# Patient Record
Sex: Female | Born: 1980 | Race: Black or African American | Hispanic: No | Smoking: Former smoker
Health system: Southern US, Community
[De-identification: ages and names within clinical notes are randomized; demographics above are authoritative.]

## PROBLEM LIST (undated history)

## (undated) DIAGNOSIS — R0600 Dyspnea, unspecified: Secondary | ICD-10-CM

## (undated) DIAGNOSIS — M5136 Other intervertebral disc degeneration, lumbar region: Secondary | ICD-10-CM

## (undated) DIAGNOSIS — R87619 Unspecified abnormal cytological findings in specimens from cervix uteri: Secondary | ICD-10-CM

## (undated) DIAGNOSIS — F419 Anxiety disorder, unspecified: Secondary | ICD-10-CM

## (undated) DIAGNOSIS — R7303 Prediabetes: Secondary | ICD-10-CM

## (undated) DIAGNOSIS — M549 Dorsalgia, unspecified: Secondary | ICD-10-CM

## (undated) DIAGNOSIS — M51369 Other intervertebral disc degeneration, lumbar region without mention of lumbar back pain or lower extremity pain: Secondary | ICD-10-CM

## (undated) HISTORY — PX: TUBAL LIGATION: SHX77

## (undated) HISTORY — DX: Other intervertebral disc degeneration, lumbar region: M51.36

## (undated) HISTORY — DX: Other intervertebral disc degeneration, lumbar region without mention of lumbar back pain or lower extremity pain: M51.369

## (undated) HISTORY — DX: Unspecified abnormal cytological findings in specimens from cervix uteri: R87.619

---

## 2004-11-04 ENCOUNTER — Emergency Department (HOSPITAL_COMMUNITY): Admission: EM | Admit: 2004-11-04 | Discharge: 2004-11-04 | Payer: Self-pay | Admitting: Emergency Medicine

## 2006-09-26 ENCOUNTER — Emergency Department (HOSPITAL_COMMUNITY): Admission: EM | Admit: 2006-09-26 | Discharge: 2006-09-26 | Payer: Self-pay | Admitting: Emergency Medicine

## 2006-12-15 ENCOUNTER — Emergency Department (HOSPITAL_COMMUNITY): Admission: EM | Admit: 2006-12-15 | Discharge: 2006-12-15 | Payer: Self-pay | Admitting: Emergency Medicine

## 2007-05-26 ENCOUNTER — Emergency Department (HOSPITAL_COMMUNITY): Admission: EM | Admit: 2007-05-26 | Discharge: 2007-05-26 | Payer: Self-pay | Admitting: Emergency Medicine

## 2008-10-15 ENCOUNTER — Emergency Department (HOSPITAL_COMMUNITY): Admission: EM | Admit: 2008-10-15 | Discharge: 2008-10-15 | Payer: Self-pay | Admitting: Emergency Medicine

## 2009-01-21 ENCOUNTER — Emergency Department (HOSPITAL_COMMUNITY): Admission: EM | Admit: 2009-01-21 | Discharge: 2009-01-21 | Payer: Self-pay | Admitting: Emergency Medicine

## 2009-05-10 ENCOUNTER — Emergency Department (HOSPITAL_COMMUNITY): Admission: EM | Admit: 2009-05-10 | Discharge: 2009-05-10 | Payer: Self-pay | Admitting: Emergency Medicine

## 2009-07-08 ENCOUNTER — Emergency Department (HOSPITAL_COMMUNITY): Admission: EM | Admit: 2009-07-08 | Discharge: 2009-07-08 | Payer: Self-pay | Admitting: Emergency Medicine

## 2009-07-18 ENCOUNTER — Emergency Department (HOSPITAL_COMMUNITY): Admission: EM | Admit: 2009-07-18 | Discharge: 2009-07-18 | Payer: Self-pay | Admitting: Emergency Medicine

## 2009-08-07 ENCOUNTER — Encounter: Payer: Self-pay | Admitting: Orthopedic Surgery

## 2009-10-16 ENCOUNTER — Encounter: Payer: Self-pay | Admitting: Orthopedic Surgery

## 2010-02-15 IMAGING — CT CT HEAD W/O CM
1 series · 16 of 30 positions shown, 20 images · non-contrast
Comparison: None

CLINICAL DATA: Headaches, dizziness, syncope.

CT HEAD WITHOUT CONTRAST
TECHNIQUE: Contiguous axial images were obtained from the base of
the skull through the vertex without contrast.

[Series 2: headseq 4.8 h37s · axial · 0.44mm/px · z∈[+96,+254]mm · 16 of 36 slices shown, 20 images]
[im 2/36  brain]
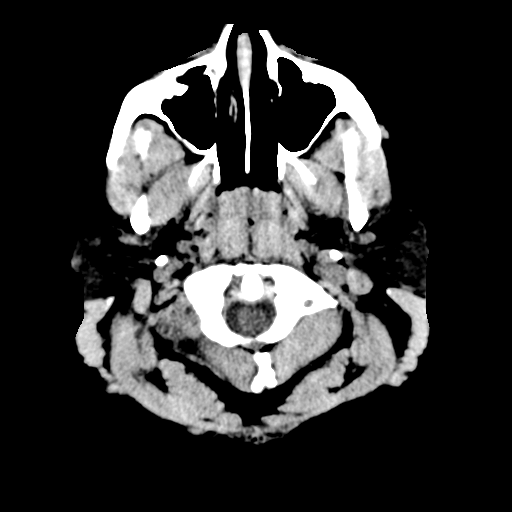
[im 2/36  bone]
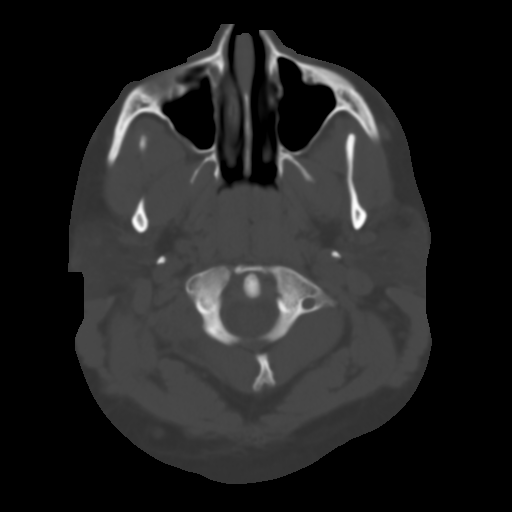
[im 4/36  brain]
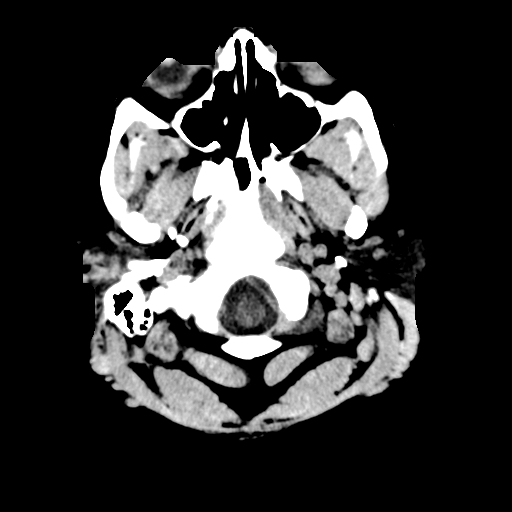
[im 7/36  brain]
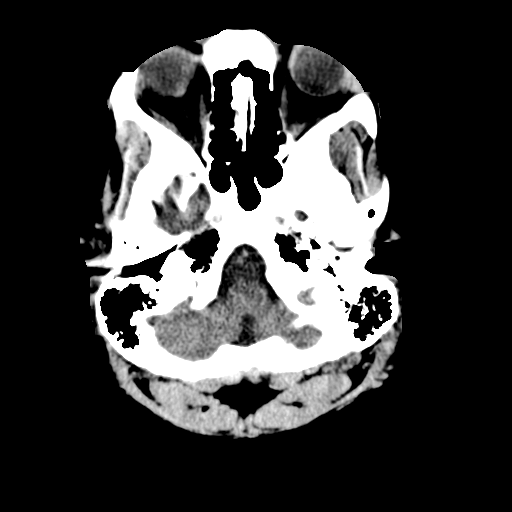
[im 9/36  brain]
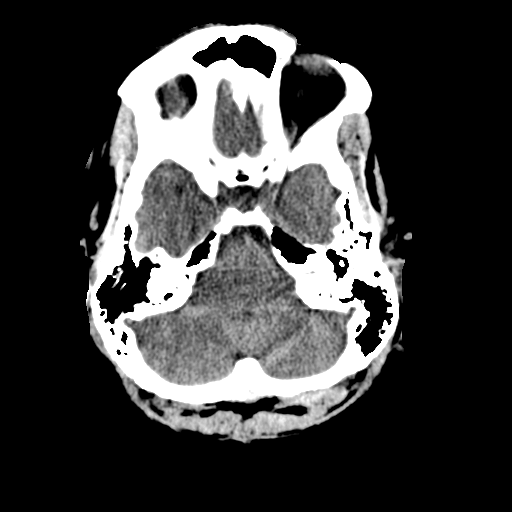
[im 10/36  brain]
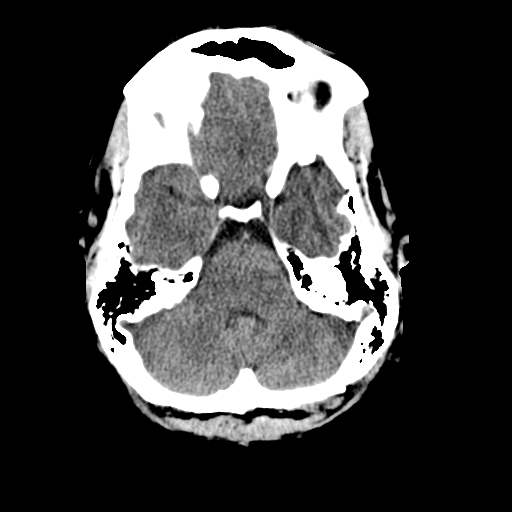
[im 10/36  bone]
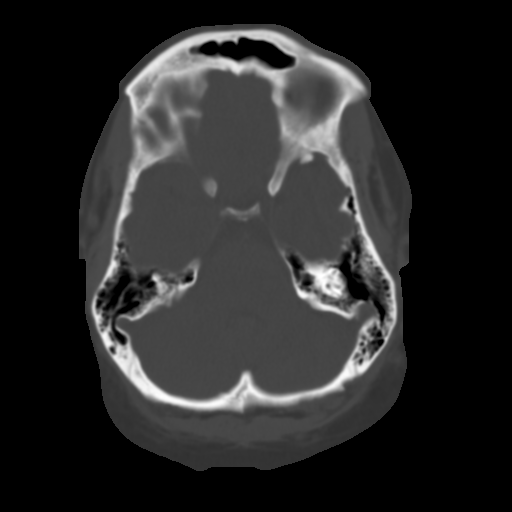
[im 13/36  brain]
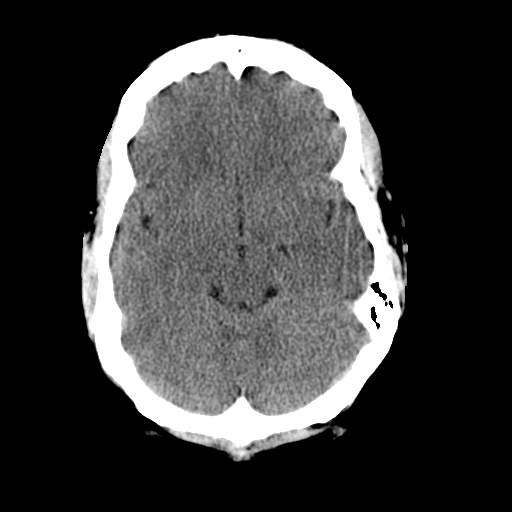
[im 15/36  brain]
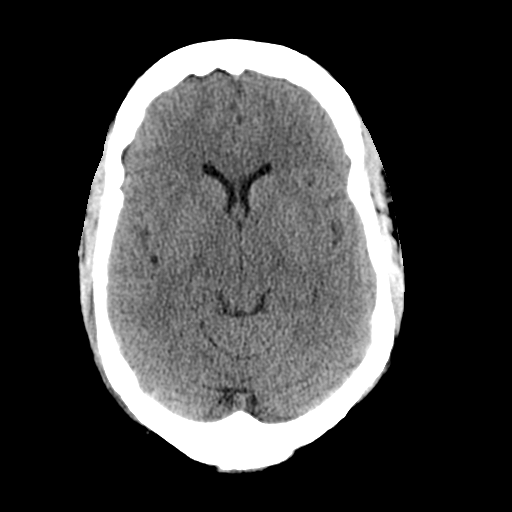
[im 17/36  brain]
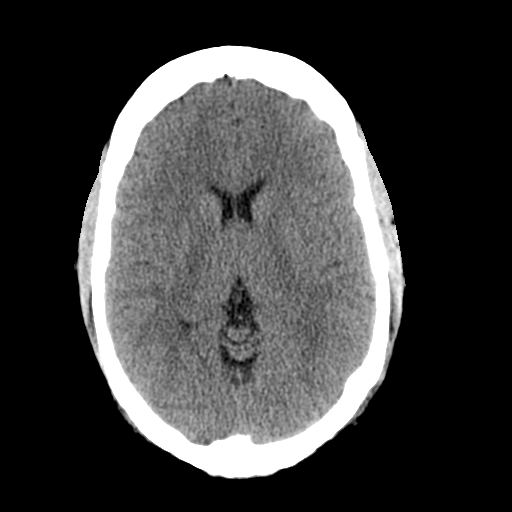
[im 19/36  brain]
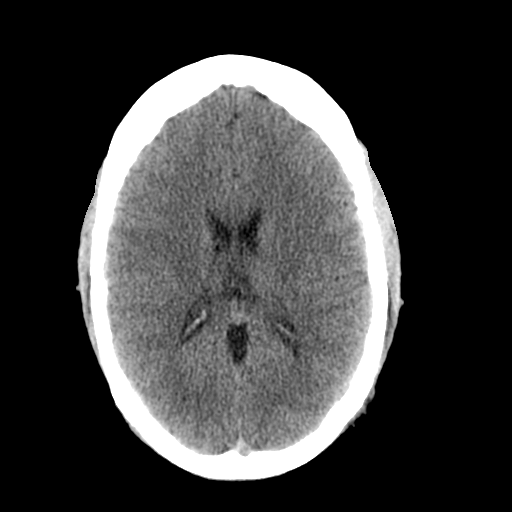
[im 19/36  bone]
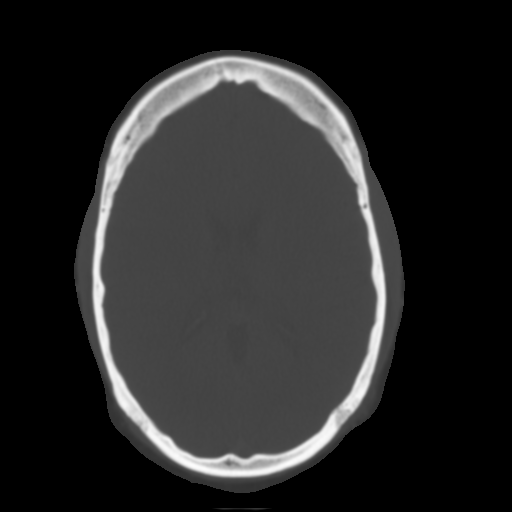
[im 21/36  brain]
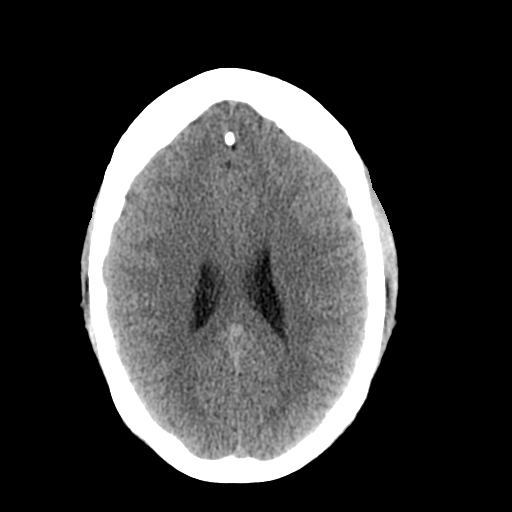
[im 23/36  brain]
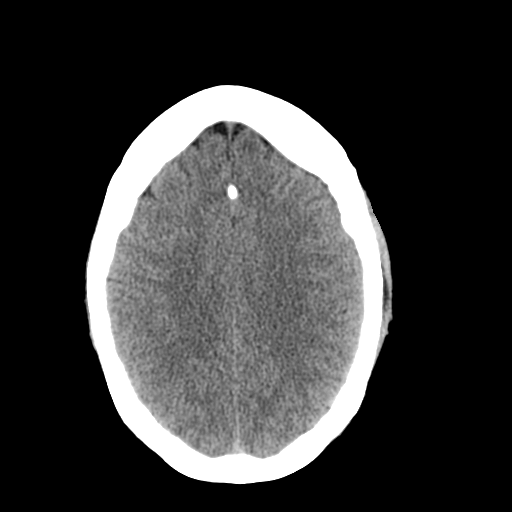
[im 26/36  brain]
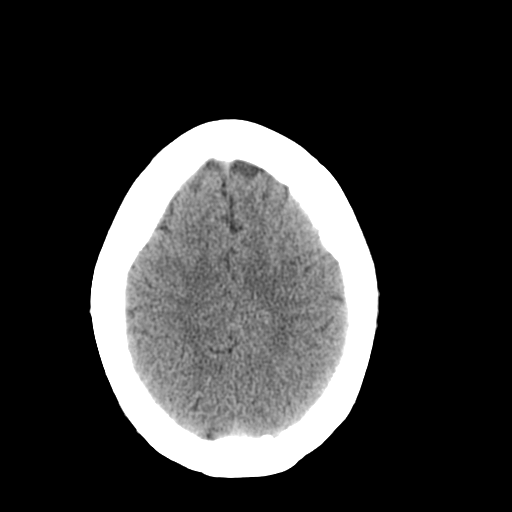
[im 27/36  brain]
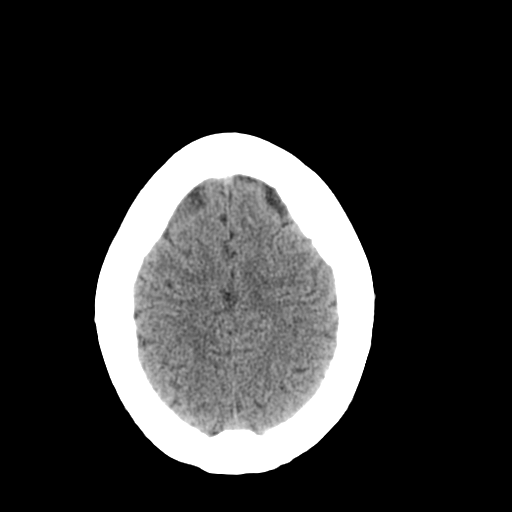
[im 27/36  bone]
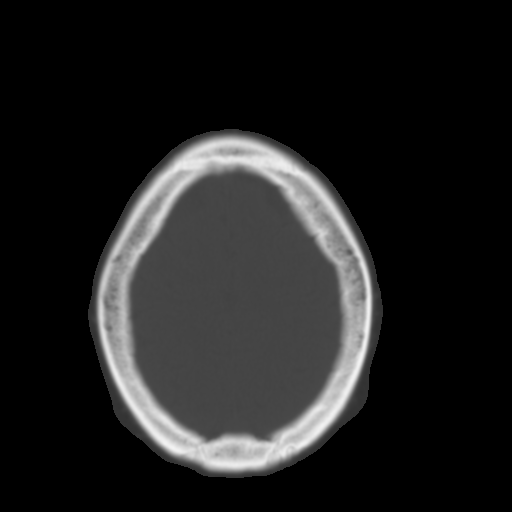
[im 29/36  brain]
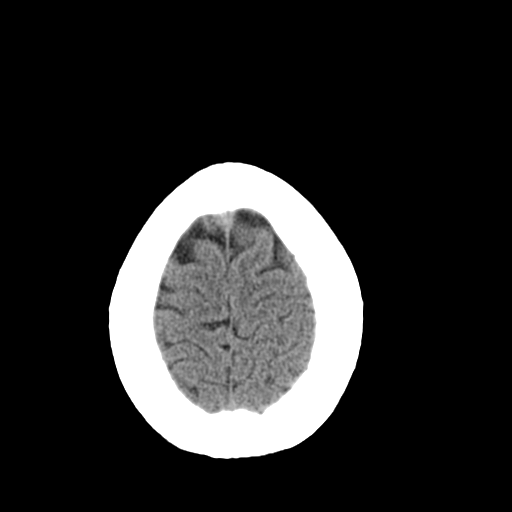
[im 32/36  brain]
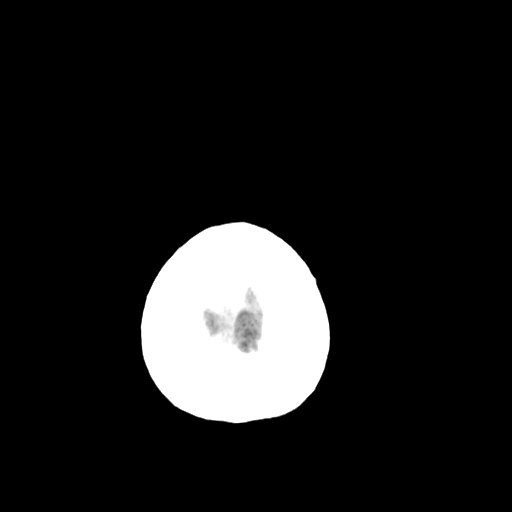
[im 34/36  brain]
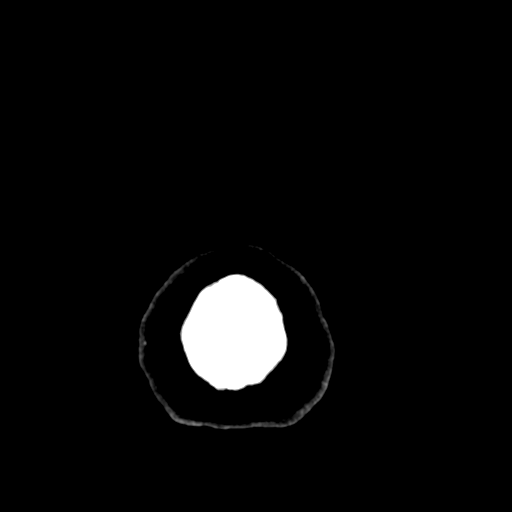

[16 of 30 positions shown; findings below may reference images not displayed]

FINDINGS: No acute intracranial abnormality.  Specifically, no
hemorrhage, hydrocephalus, mass lesion, acute infarction, or
significant intracranial injury.  No acute calvarial abnormality.

Visualized paranasal sinuses and mastoids clear.  Orbital soft
tissues unremarkable.
IMPRESSION: No acute intracranial abnormality.

## 2010-04-27 ENCOUNTER — Emergency Department (HOSPITAL_COMMUNITY): Admission: EM | Admit: 2010-04-27 | Discharge: 2010-04-27 | Payer: Self-pay | Admitting: Emergency Medicine

## 2010-05-05 ENCOUNTER — Emergency Department (HOSPITAL_COMMUNITY): Admission: EM | Admit: 2010-05-05 | Discharge: 2010-05-05 | Payer: Self-pay | Admitting: Emergency Medicine

## 2010-09-02 NOTE — Letter (Signed)
Summary: Phyllis Daniels Referral No Show  [facility]  [address] [place] [address]  [place], [place] [address]   Phone: [phone]  Fax: [phone]    10/15/09  [facility] [address] Attn: Referrals Ph   [phone] Fax [phone]   Re:    Phyllis Daniels DOB:    06-09-1981    The above named patient was a no show for the scheduled appointment:  Date:  10/08/09 Time:  1:45pm  Patient had been a no show for a previous appointment on 08/05/09.   Thank you for your kind referral.  Please feel free to contact our office if we can be of further service.  Sincerely,   [facility] Office of Dr. Terrance Mass, MD

## 2010-09-02 NOTE — Letter (Signed)
Summary: *Orthopedic No Show Letter  [facility]  [address]  [place], [place] [address]   Phone: [phone]  Fax: [phone]     08/07/2009    [facility] [address] [place], [place]  [address]   Dear Ms. Patterson Hammersmith,   Our records indicate that you missed your scheduled appointment with Dr. Beaulah Corin on _1/10/2009.  Please contact this office to reschedule your appointment as soon as possible.  It is important that you keep your scheduled appointments with your physician, so we can provide you the best care possible.  We have enclosed an appointment card for your convenience.      Sincerely,    Dr. Terrance Mass, MD [facility] Phone [phone]

## 2010-09-16 ENCOUNTER — Emergency Department (HOSPITAL_COMMUNITY)
Admission: EM | Admit: 2010-09-16 | Discharge: 2010-09-16 | Disposition: A | Discharge status: Home / Self Care | Payer: Self-pay | Attending: Emergency Medicine | Admitting: Emergency Medicine

## 2010-09-16 DIAGNOSIS — G8929 Other chronic pain: Secondary | ICD-10-CM | POA: Insufficient documentation

## 2010-09-16 DIAGNOSIS — M549 Dorsalgia, unspecified: Secondary | ICD-10-CM | POA: Insufficient documentation

## 2010-10-16 LAB — CBC
HCT: 37 % (ref 36.0–46.0)
MCH: 29.5 pg (ref 26.0–34.0)
MCV: 89.5 fL (ref 78.0–100.0)
Platelets: 169 10*3/uL (ref 150–400)
RBC: 4.13 MIL/uL (ref 3.87–5.11)
WBC: 9.9 10*3/uL (ref 4.0–10.5)

## 2010-10-16 LAB — DIFFERENTIAL
Eosinophils Absolute: 0.4 10*3/uL (ref 0.0–0.7)
Eosinophils Relative: 4 % (ref 0–5)
Lymphocytes Relative: 21 % (ref 12–46)
Lymphs Abs: 2.1 10*3/uL (ref 0.7–4.0)
Monocytes Absolute: 0.6 10*3/uL (ref 0.1–1.0)
Monocytes Relative: 6 % (ref 3–12)

## 2010-10-16 LAB — URINALYSIS, ROUTINE W REFLEX MICROSCOPIC
Bilirubin Urine: NEGATIVE
Glucose, UA: NEGATIVE mg/dL
Protein, ur: NEGATIVE mg/dL
Urobilinogen, UA: 0.2 mg/dL (ref 0.0–1.0)

## 2010-10-16 LAB — POCT CARDIAC MARKERS
CKMB, poc: <1 ng/mL — ABNORMAL LOW (ref 1.0–8.0)
Troponin i, poc: <0.05 ng/mL (ref 0.00–0.09)

## 2010-10-16 LAB — BASIC METABOLIC PANEL
BUN: 11 mg/dL (ref 6–23)
CO2: 29 mEq/L (ref 19–32)
Chloride: 107 mEq/L (ref 96–112)
Creatinine, Ser: 0.76 mg/dL (ref 0.4–1.2)
GFR calc Af Amer: >60 mL/min (ref >60)
Potassium: 3.9 mEq/L (ref 3.5–5.1)

## 2010-10-16 LAB — POCT PREGNANCY, URINE: Preg Test, Ur: NEGATIVE

## 2010-10-16 LAB — URINE MICROSCOPIC-ADD ON

## 2010-11-13 LAB — URINALYSIS, ROUTINE W REFLEX MICROSCOPIC
Glucose, UA: NEGATIVE mg/dL
Ketones, ur: NEGATIVE mg/dL
Nitrite: NEGATIVE
Protein, ur: NEGATIVE mg/dL
Urobilinogen, UA: 0.2 mg/dL (ref 0.0–1.0)

## 2010-11-13 LAB — BASIC METABOLIC PANEL
Chloride: 106 mEq/L (ref 96–112)
Creatinine, Ser: 0.89 mg/dL (ref 0.4–1.2)
GFR calc Af Amer: >60 mL/min (ref >60)
Sodium: 141 mEq/L (ref 135–145)

## 2010-11-13 LAB — RAPID URINE DRUG SCREEN, HOSP PERFORMED
Barbiturates: NOT DETECTED
Benzodiazepines: NOT DETECTED

## 2010-11-13 LAB — CBC
Hemoglobin: 12.7 g/dL (ref 12.0–15.0)
MCV: 88.8 fL (ref 78.0–100.0)
RBC: 4.28 MIL/uL (ref 3.87–5.11)
WBC: 7.7 10*3/uL (ref 4.0–10.5)

## 2010-11-13 LAB — DIFFERENTIAL
Eosinophils Absolute: 0.3 10*3/uL (ref 0.0–0.7)
Lymphs Abs: 1.8 10*3/uL (ref 0.7–4.0)
Monocytes Absolute: 0.2 10*3/uL (ref 0.1–1.0)
Monocytes Relative: 3 % (ref 3–12)
Neutro Abs: 5.4 10*3/uL (ref 1.7–7.7)
Neutrophils Relative %: 70 % (ref 43–77)

## 2010-11-13 LAB — PREGNANCY, URINE: Preg Test, Ur: NEGATIVE

## 2010-11-13 LAB — ETHANOL: Alcohol, Ethyl (B): <5 mg/dL (ref 0–10)

## 2011-04-22 ENCOUNTER — Encounter: Payer: Self-pay | Admitting: *Deleted

## 2011-04-22 ENCOUNTER — Emergency Department (HOSPITAL_COMMUNITY)
Admission: EM | Admit: 2011-04-22 | Discharge: 2011-04-22 | Disposition: A | Discharge status: Home / Self Care | Payer: Self-pay | Attending: Emergency Medicine | Admitting: Emergency Medicine

## 2011-04-22 DIAGNOSIS — F17200 Nicotine dependence, unspecified, uncomplicated: Secondary | ICD-10-CM | POA: Insufficient documentation

## 2011-04-22 DIAGNOSIS — M545 Low back pain, unspecified: Secondary | ICD-10-CM | POA: Insufficient documentation

## 2011-04-22 DIAGNOSIS — M549 Dorsalgia, unspecified: Secondary | ICD-10-CM

## 2011-04-22 HISTORY — DX: Dorsalgia, unspecified: M54.9

## 2011-04-22 MED ORDER — HYDROCODONE-ACETAMINOPHEN 5-325 MG PO TABS: ORAL_TABLET | ORAL | Status: AC

## 2011-04-22 MED ORDER — METHOCARBAMOL 500 MG PO TABS
1000.0000 mg | ORAL_TABLET | Freq: Once | ORAL | Status: AC
  Administered 2011-04-22: 1000 mg via ORAL
  Filled 2011-04-22: qty 2

## 2011-04-22 MED ORDER — KETOROLAC TROMETHAMINE 60 MG/2ML IM SOLN
60.0000 mg | Freq: Once | INTRAMUSCULAR | Status: AC
  Administered 2011-04-22: 60 mg via INTRAMUSCULAR
  Filled 2011-04-22: qty 2

## 2011-04-22 MED ORDER — METHOCARBAMOL 500 MG PO TABS: 1000.0000 mg | ORAL_TABLET | Freq: Three times a day (TID) | ORAL | Status: AC

## 2011-04-22 NOTE — ED Provider Notes (Signed)
History     CSN: [id number] Arrival date & time: 04/22/2011  5:56 PM   Chief Complaint  Patient presents with  . Back Pain     (Include location/radiation/quality/duration/timing/severity/associated sxs/prior treatment) Patient is a 30 y.o. female presenting with back pain. The history is provided by the patient.  Back Pain  This is a recurrent problem. The current episode started yesterday. The problem occurs constantly. The problem has not changed since onset.The pain is associated with no known injury. The pain is present in the lumbar spine. The quality of the pain is described as aching. The pain radiates to the left thigh and right thigh. The pain is moderate. The symptoms are aggravated by bending, twisting and certain positions. The pain is the same all the time. Associated symptoms include leg pain and tingling. Pertinent negatives include no chest pain, no fever, no numbness, no abdominal pain, no bowel incontinence, no perianal numbness, no bladder incontinence, no dysuria, no pelvic pain, no paresis and no weakness. She has tried nothing for the symptoms. The treatment provided no relief. Risk factors include obesity.     Past Medical History  Diagnosis Date  . Back pain      Past Surgical History  Procedure Date  . Tubal ligation     History reviewed. No pertinent family history.  History  Substance Use Topics  . Smoking status: Current Everyday Smoker -- 0.5 packs/day  . Smokeless tobacco: Not on file  . Alcohol Use: No    OB History    Grav Para Term Preterm Abortions TAB SAB Ect Mult Living                  Review of Systems  Constitutional: Negative for fever.  Cardiovascular: Negative for chest pain.  Gastrointestinal: Negative for abdominal pain and bowel incontinence.  Genitourinary: Negative for bladder incontinence, dysuria, hematuria, decreased urine volume, vaginal bleeding, vaginal discharge, difficulty urinating and pelvic pain.    Musculoskeletal: Positive for back pain. Negative for gait problem.  Skin: Negative.   Neurological: Positive for tingling. Negative for weakness and numbness.  Hematological: Does not bruise/bleed easily.  All other systems reviewed and are negative.    Allergies  Review of patient's allergies indicates no known allergies.  Home Medications  No current outpatient prescriptions on file.  Physical Exam    BP 121/78  Pulse 70  Temp(Src) 99 F (37.2 C) (Oral)  Resp 20  Ht 6\' 1"  (1.854 m)  Wt 324 lb (146.965 kg)  BMI 42.75 kg/m2  SpO2 100%  Physical Exam  Nursing note and vitals reviewed. Constitutional: She is oriented to person, place, and time. She appears well-developed and well-nourished. No distress.  HENT:  Head: Normocephalic and atraumatic.  Neck: Normal range of motion. Neck supple.  Cardiovascular: Normal rate, regular rhythm and normal heart sounds.   Pulmonary/Chest: Effort normal and breath sounds normal.  Abdominal: Soft. Bowel sounds are normal. There is no tenderness.  Musculoskeletal: She exhibits tenderness. She exhibits no edema.       Lumbar back: She exhibits tenderness and pain. She exhibits normal range of motion, no bony tenderness, no swelling, no edema, no laceration, no spasm and normal pulse.  Neurological: She is alert and oriented to person, place, and time. She has normal reflexes. No cranial nerve deficit or sensory deficit. She exhibits normal muscle tone. Coordination and gait normal.  Reflex Scores:      Patellar reflexes are 2+ on the right side and 2+ on the  left side.      Achilles reflexes are 2+ on the right side and 2+ on the left side. Skin: Skin is warm and dry.    ED Course  Procedures        MDM   Patient has diffuse ttp of the lumbar paraspinal muscles and right SI joint space.  Patient is ambulatory, no focal neuro deficits on exam.  DTR's nml.  Has hx of chronic low back pain and pain today is similar to previous  episodes.  Patient is waiting for Medicaid approval to f/u with pain management.         Phyllis Daniels, [place] 04/26/11 2102

## 2011-04-22 NOTE — ED Notes (Signed)
Pt a/ox4. Resp even and unlabored. NAD at this time. D/C instructions reviewed with pt. Pt verbalized understanding. Pt ambulated with steady gate to POV. 

## 2011-04-22 NOTE — ED Notes (Signed)
C/o back pain onset yesterday

## 2011-05-01 NOTE — ED Provider Notes (Signed)
Medical screening examination/treatment/procedure(s) were performed by non-physician practitioner and as supervising physician I was immediately available for consultation/collaboration.   Pierre Cumpton, MD 05/01/11 0510 

## 2011-12-01 ENCOUNTER — Encounter (HOSPITAL_COMMUNITY): Payer: Self-pay

## 2011-12-01 ENCOUNTER — Emergency Department (HOSPITAL_COMMUNITY)
Admission: EM | Admit: 2011-12-01 | Discharge: 2011-12-01 | Disposition: A | Discharge status: Home / Self Care | Payer: Self-pay | Attending: Emergency Medicine | Admitting: Emergency Medicine

## 2011-12-01 DIAGNOSIS — R10819 Abdominal tenderness, unspecified site: Secondary | ICD-10-CM | POA: Insufficient documentation

## 2011-12-01 DIAGNOSIS — F17200 Nicotine dependence, unspecified, uncomplicated: Secondary | ICD-10-CM | POA: Insufficient documentation

## 2011-12-01 DIAGNOSIS — R109 Unspecified abdominal pain: Secondary | ICD-10-CM | POA: Insufficient documentation

## 2011-12-01 DIAGNOSIS — R609 Edema, unspecified: Secondary | ICD-10-CM | POA: Insufficient documentation

## 2011-12-01 LAB — COMPREHENSIVE METABOLIC PANEL
AST: 21 U/L (ref 0–37)
BUN: 7 mg/dL (ref 6–23)
CO2: 26 mEq/L (ref 19–32)
Calcium: 9.7 mg/dL (ref 8.4–10.5)
Creatinine, Ser: 0.73 mg/dL (ref 0.50–1.10)
GFR calc non Af Amer: >90 mL/min (ref >90)

## 2011-12-01 LAB — DIFFERENTIAL
Basophils Absolute: 0 10*3/uL (ref 0.0–0.1)
Basophils Relative: 0 % (ref 0–1)
Eosinophils Relative: 3 % (ref 0–5)
Lymphocytes Relative: 30 % (ref 12–46)
Monocytes Absolute: 0.5 10*3/uL (ref 0.1–1.0)
Monocytes Relative: 7 % (ref 3–12)

## 2011-12-01 LAB — URINALYSIS, ROUTINE W REFLEX MICROSCOPIC
Glucose, UA: NEGATIVE mg/dL
Hgb urine dipstick: NEGATIVE
Specific Gravity, Urine: >1.03 — ABNORMAL HIGH (ref 1.005–1.030)

## 2011-12-01 LAB — CBC
HCT: 44.3 % (ref 36.0–46.0)
MCHC: 32.5 g/dL (ref 30.0–36.0)
MCV: 90.6 fL (ref 78.0–100.0)
RDW: 13.4 % (ref 11.5–15.5)

## 2011-12-01 LAB — LIPASE, BLOOD: Lipase: 17 U/L (ref 11–59)

## 2011-12-01 LAB — POCT PREGNANCY, URINE: Preg Test, Ur: NEGATIVE

## 2011-12-01 MED ORDER — PANTOPRAZOLE SODIUM 40 MG IV SOLR
40.0000 mg | Freq: Once | INTRAVENOUS | Status: AC
  Administered 2011-12-01: 40 mg via INTRAVENOUS
  Filled 2011-12-01: qty 40

## 2011-12-01 MED ORDER — OMEPRAZOLE 40 MG PO CPDR: 40.0000 mg | DELAYED_RELEASE_CAPSULE | Freq: Every day | ORAL | Status: DC

## 2011-12-01 MED ORDER — GI COCKTAIL ~~LOC~~
30.0000 mL | Freq: Once | ORAL | Status: AC
  Administered 2011-12-01: 30 mL via ORAL
  Filled 2011-12-01: qty 30

## 2011-12-01 NOTE — ED Notes (Signed)
Pt says pain in lower abd feels like a cramp but then top of abd gets tight.

## 2011-12-01 NOTE — Discharge Instructions (Signed)
Abdominal Pain Abdominal pain can be caused by many things. Your caregiver decides the seriousness of your pain by an examination and possibly blood tests and X-rays. Many cases can be observed and treated at home. Most abdominal pain is not caused by a disease and will probably improve without treatment. However, in many cases, more time must pass before a clear cause of the pain can be found. Before that point, it may not be known if you need more testing, or if hospitalization or surgery is needed. HOME CARE INSTRUCTIONS   Do not take laxatives unless directed by your caregiver.   Take pain medicine only as directed by your caregiver.   Only take over-the-counter or prescription medicines for pain, discomfort, or fever as directed by your caregiver.   Try a clear liquid diet (broth, tea, or water) for as long as directed by your caregiver. Slowly move to a bland diet as tolerated.  SEEK IMMEDIATE MEDICAL CARE IF:   The pain does not go away.   You have a fever.   You keep throwing up (vomiting).   The pain is felt only in portions of the abdomen. Pain in the right side could possibly be appendicitis. In an adult, pain in the left lower portion of the abdomen could be colitis or diverticulitis.   You pass bloody or black tarry stools.  MAKE SURE YOU:   Understand these instructions.   Will watch your condition.   Will get help right away if you are not doing well or get worse.  Document Released: 04/29/2005 Document Revised: 07/09/2011 Document Reviewed: 03/07/2008 [facility] Patient Information 2012 [place], [place].  Omeprazole tablets (OTC) What is this medicine? OMEPRAZOLE (oh ME pray zol) prevents the production of acid in the stomach. It is used to treat the symptoms of heartburn. You can buy this medicine without a prescription. This product is not for long-term use, unless otherwise directed by your doctor or health care professional. This medicine may be used for other  purposes; ask your health care provider or pharmacist if you have questions. What should I tell my health care provider before I take this medicine? They need to know if you have any of these conditions: -black or bloody stools -chest pain -difficulty swallowing -have had heartburn for over 3 months -have heartburn with dizziness, lightheadedness or sweating -liver disease -stomach pain -unexplained weight loss -vomiting with blood -wheezing -an unusual or allergic reaction to omeprazole, other medicines, foods, dyes, or preservatives -pregnant or trying to get pregnant -breast-feeding How should I use this medicine? Take this medicine by mouth. Follow the directions on the product label. If you are taking this medicine without a prescription, take one tablet every day. Do not use for longer than 14 days or repeat a course of treatment more often than every 4 months unless directed by a doctor or healthcare professional. Take your dose at regular intervals every 24 hours. Swallow the tablet whole with a drink of water. Do not crush, break or chew. This medicine works best if taken on an empty stomach 30 minutes before breakfast. If you are using this medicine with the prescription of your doctor or healthcare professional, follow the directions you were given. Do not take your medicine more often than directed. Talk to your pediatrician regarding the use of this medicine in children. Special care may be needed. Overdosage: If you think you have taken too much of this medicine contact a poison control center or emergency room at once. NOTE:  This medicine is only for you. Do not share this medicine with others. What if I miss a dose? If you miss a dose, take it as soon as you can. If it is almost time for your next dose, take only that dose. Do not take double or extra doses. What may interact with this medicine? Do not take this medicine with any of the following  medications: -atazanavir -clopidogrel -nelfinavir This medicine may also interact with the following medications: -ampicillin -certain medicines for anxiety or sleep -certain medicines that treat or prevent blood clots like warfarin -cyclosporine -diazepam -digoxin -disulfiram -iron salts -phenytoin -prescription medicine for fungal or yeast infection like itraconazole, ketoconazole, voriconazole -saquinavir -tacrolimus This list may not describe all possible interactions. Give your health care provider a list of all the medicines, herbs, non-prescription drugs, or dietary supplements you use. Also tell them if you smoke, drink alcohol, or use illegal drugs. Some items may interact with your medicine. What should I watch for while using this medicine? It can take several days before your heartburn gets better. Check with your doctor or health care professional if your condition does not start to get better, or if it gets worse. Do not treat yourself for heartburn with this medicine for more than 14 days in a row. You should only use this medicine for a 2-week treatment period once every 4 months. If your symptoms return shortly after your therapy is complete, or within the 4 month time frame, call your doctor or health care professional. What side effects may I notice from receiving this medicine? Side effects that you should report to your doctor or health care professional as soon as possible: -allergic reactions like skin rash, itching or hives, swelling of the face, lips, or tongue -bone, muscle or joint pain -breathing problems -chest pain or chest tightness -dark yellow or brown urine -dizziness -fast, irregular heartbeat -feeling faint or lightheaded -fever or sore throat -muscle spasm -palpitations -redness, blistering, peeling or loosening of the skin, including inside the mouth -seizures -tremors -unusual bleeding or bruising -unusually weak or tired -yellowing of the  eyes or skin Side effects that usually do not require medical attention (Report these to your doctor or health care professional if they continue or are bothersome.): -constipation -diarrhea -dry mouth -headache -nausea This list may not describe all possible side effects. Call your doctor for medical advice about side effects. You may report side effects to FDA at 1-800-FDA-1088. Where should I keep my medicine? Keep out of the reach of children. Store at room temperature between 20 and 25 degrees C (68 and 77 degrees F). Protect from light and moisture. Throw away any unused medicine after the expiration date. NOTE: This sheet is a summary. It may not cover all possible information. If you have questions about this medicine, talk to your doctor, pharmacist, or health care provider.  2012, [employer]. (05/14/2010 1:43:26 PM)

## 2011-12-01 NOTE — ED Provider Notes (Signed)
History   This chart was scribed for Dione Booze, MD by Sofie Rower. The patient was seen in room APA10/APA10 and the patient's care was started at 12:00 PM     CSN: [id number]  Arrival date & time 12/01/11  1122   First MD Initiated Contact with Patient 12/01/11 1149      Chief Complaint  Patient presents with  . Abdominal Pain    (Consider location/radiation/quality/duration/timing/severity/associated sxs/prior treatment) HPI  Phyllis Daniels is a 31 y.o. female who presents to the Emergency Department complaining of moderate, episodic abdominal pain located at the upper abdomen onset two days ago with associated symptoms of tightness, cramping, loss of sleep. The pt states "the abdominal pain has been bothering her at night." The pt informs the EDP that the "abdominal pain is a bubbling pain." While exercising yesterday, the pt states "it felt like I was getting cold and I was going to faint." Modifying factors include exercising which intensifies the pain. Pt has a hx of tubal ligation.   Pt denies nausea, vomiting, similar symptoms in the past.   Pt is a smoker, smokes 6-7 cigarettes a day.   PCP is [facility].   Past Medical History  Diagnosis Date  . Back pain     Past Surgical History  Procedure Date  . Tubal ligation      History  Substance Use Topics  . Smoking status: Current Everyday Smoker -- 0.5 packs/day  . Smokeless tobacco: Not on file  . Alcohol Use: No    OB History    Grav Para Term Preterm Abortions TAB SAB Ect Mult Living                  Review of Systems  All other systems reviewed and are negative.    10 Systems reviewed and all are negative for acute change except as noted in the HPI.    Allergies  Review of patient's allergies indicates no known allergies.  Home Medications  No current outpatient prescriptions on file.  BP 122/107  Pulse 80  Temp(Src) 98.5 F (36.9 C) (Oral)  Resp 20  Ht 6' (1.829 m)   Wt 323 lb (146.512 kg)  BMI 43.81 kg/m2  SpO2 98%  Physical Exam  Nursing note and vitals reviewed. Constitutional: She is oriented to person, place, and time. She appears well-developed and well-nourished.  HENT:  Head: Normocephalic and atraumatic.  Right Ear: External ear normal.  Left Ear: External ear normal.  Nose: Nose normal.  Eyes: Conjunctivae and EOM are normal. Right eye exhibits no discharge. Left eye exhibits no discharge. No scleral icterus.       No scleral icterus.   Neck: Normal range of motion. Neck supple.  Cardiovascular: Normal rate, regular rhythm and normal heart sounds.   Pulmonary/Chest: Effort normal.  Abdominal: There is tenderness (Mild across the upper abdomen, no rebound, no guarding. ). There is no rebound and no guarding.  Musculoskeletal: Normal range of motion.       1+ edema.   Neurological: She is alert and oriented to person, place, and time.  Skin: Skin is warm and dry.  Psychiatric: She has a normal mood and affect. Her behavior is normal.    ED Course  Procedures (including critical care time)  DIAGNOSTIC STUDIES: Oxygen Saturation is 98% on room air, normal by my interpretation.    COORDINATION OF CARE:    Results for orders placed during the hospital encounter of 12/01/11  CBC  Component Value Range   WBC 7.8  4.0 - 10.5 (K/uL)   RBC 4.89  3.87 - 5.11 (MIL/uL)   Hemoglobin 14.4  12.0 - 15.0 (g/dL)   HCT 08.6  57.8 - 46.9 (%)   MCV 90.6  78.0 - 100.0 (fL)   MCH 29.4  26.0 - 34.0 (pg)   MCHC 32.5  30.0 - 36.0 (g/dL)   RDW 62.9  52.8 - 41.3 (%)   Platelets 248  150 - 400 (K/uL)  DIFFERENTIAL      Component Value Range   Neutrophils Relative 60  43 - 77 (%)   Neutro Abs 4.7  1.7 - 7.7 (K/uL)   Lymphocytes Relative 30  12 - 46 (%)   Lymphs Abs 2.3  0.7 - 4.0 (K/uL)   Monocytes Relative 7  3 - 12 (%)   Monocytes Absolute 0.5  0.1 - 1.0 (K/uL)   Eosinophils Relative 3  0 - 5 (%)   Eosinophils Absolute 0.3  0.0 - 0.7  (K/uL)   Basophils Relative 0  0 - 1 (%)   Basophils Absolute 0.0  0.0 - 0.1 (K/uL)  COMPREHENSIVE METABOLIC PANEL      Component Value Range   Sodium 139  135 - 145 (mEq/L)   Potassium 3.7  3.5 - 5.1 (mEq/L)   Chloride 103  96 - 112 (mEq/L)   CO2 26  19 - 32 (mEq/L)   Glucose, Bld 96  70 - 99 (mg/dL)   BUN 7  6 - 23 (mg/dL)   Creatinine, Ser 2.44  0.50 - 1.10 (mg/dL)   Calcium 9.7  8.4 - 01.0 (mg/dL)   Total Protein 7.5  6.0 - 8.3 (g/dL)   Albumin 3.8  3.5 - 5.2 (g/dL)   AST 21  0 - 37 (U/L)   ALT 18  0 - 35 (U/L)   Alkaline Phosphatase 108  39 - 117 (U/L)   Total Bilirubin 0.4  0.3 - 1.2 (mg/dL)   GFR calc non Af Amer >90  >90 (mL/min)   GFR calc Af Amer >90  >90 (mL/min)  LIPASE, BLOOD      Component Value Range   Lipase 17  11 - 59 (U/L)  POCT PREGNANCY, URINE      Component Value Range   Preg Test, Ur NEGATIVE  NEGATIVE    She was given IV hydration, oral GI cocktail, and IV protonic 6. Following this, she had moderate improvement. She will be sent home with a prescription for Prilosec OTC and she is to followup with her PCP in [facility]. She is to return if symptoms worsen.  1. Abdominal pain     12:03PM- EDP at bedside discusses treatment plan concerning blood tests.   MDM  Abdominal pain which may represent gastritis.      I personally performed the services described in this documentation, which was scribed in my presence. The recorded information has been reviewed and considered.      Dione Booze, MD 12/01/11 1450

## 2011-12-01 NOTE — ED Notes (Signed)
Pt c/o lower abd pain since Sunday.  Also reports some constipation.  LBM was this am but says was small.  LMP was last year some time.  Reports has had irregular periods.

## 2012-03-14 ENCOUNTER — Encounter (HOSPITAL_COMMUNITY): Payer: Self-pay | Admitting: *Deleted

## 2012-03-14 ENCOUNTER — Emergency Department (HOSPITAL_COMMUNITY)
Admission: EM | Admit: 2012-03-14 | Discharge: 2012-03-14 | Disposition: A | Discharge status: Home / Self Care | Payer: Self-pay | Attending: Emergency Medicine | Admitting: Emergency Medicine

## 2012-03-14 DIAGNOSIS — F17200 Nicotine dependence, unspecified, uncomplicated: Secondary | ICD-10-CM | POA: Insufficient documentation

## 2012-03-14 DIAGNOSIS — M549 Dorsalgia, unspecified: Secondary | ICD-10-CM | POA: Insufficient documentation

## 2012-03-14 DIAGNOSIS — M5430 Sciatica, unspecified side: Secondary | ICD-10-CM

## 2012-03-14 MED ORDER — METHYLPREDNISOLONE SODIUM SUCC 125 MG IJ SOLR
125.0000 mg | Freq: Once | INTRAMUSCULAR | Status: AC
  Administered 2012-03-14: 125 mg via INTRAMUSCULAR
  Filled 2012-03-14: qty 2

## 2012-03-14 MED ORDER — HYDROCODONE-ACETAMINOPHEN 5-325 MG PO TABS: 1.0000 | ORAL_TABLET | Freq: Four times a day (QID) | ORAL | Status: AC | PRN

## 2012-03-14 MED ORDER — PREDNISONE 10 MG PO TABS: 20.0000 mg | ORAL_TABLET | Freq: Every day | ORAL | Status: DC

## 2012-03-14 MED ORDER — HYDROMORPHONE HCL PF 1 MG/ML IJ SOLN
1.0000 mg | Freq: Once | INTRAMUSCULAR | Status: AC
  Administered 2012-03-14: 1 mg via INTRAMUSCULAR
  Filled 2012-03-14: qty 1

## 2012-03-14 MED ORDER — CYCLOBENZAPRINE HCL 10 MG PO TABS: 10.0000 mg | ORAL_TABLET | Freq: Three times a day (TID) | ORAL | Status: AC | PRN

## 2012-03-14 NOTE — ED Notes (Signed)
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

## 2012-03-14 NOTE — ED Provider Notes (Signed)
History   This chart was scribed for Phyllis Lennert, MD by Charolett Bumpers . The patient was seen in room APA19/APA19. Patient's care was started at 0756.    CSN: [id number]  Arrival date & time 03/14/12  4098   First MD Initiated Contact with Patient 03/14/12 0756      Chief Complaint  Patient presents with  . Back Pain    (Consider location/radiation/quality/duration/timing/severity/associated sxs/prior treatment) HPI Comments: Phyllis Daniels is a 31 y.o. female who presents to the Emergency Department complaining of constant, moderate lower back pain with an onset of last night. 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 Pt reports that the lower back pain radiates to her right leg, described as sharp pain shooting down right leg. Pt reports a h/o back pain and reports that today's symptoms feel similar to previous episodes of back pain.   Patient is a 31 y.o. female presenting with back pain. The history is provided by the patient.  Back Pain  This is a recurrent problem. The current episode started yesterday. The problem occurs constantly. The pain is associated with lifting heavy objects. The pain is present in the lumbar spine. The quality of the pain is described as shooting. The pain radiates to the right thigh. The pain is moderate. Pertinent negatives include no chest pain, no headaches and no abdominal pain.     Past Medical History  Diagnosis Date  . Back pain     Past Surgical History  Procedure Date  . Tubal ligation     No family history on file.  History  Substance Use Topics  . Smoking status: Current Everyday Smoker -- 0.5 packs/day  . Smokeless tobacco: Not on file  . Alcohol Use: No    OB History    Grav Para Term Preterm Abortions TAB SAB Ect Mult Living                  Review of Systems  Constitutional: Negative for fatigue.  HENT: Negative for congestion, sinus pressure and ear discharge.    Eyes: Negative for discharge.  Respiratory: Negative for cough.   Cardiovascular: Negative for chest pain.  Gastrointestinal: Negative for abdominal pain and diarrhea.  Genitourinary: Negative for frequency and hematuria.  Musculoskeletal: Positive for back pain.  Skin: Negative for rash.  Neurological: Negative for seizures and headaches.  Hematological: Negative.   Psychiatric/Behavioral: Negative for hallucinations.  All other systems reviewed and are negative.    Allergies  Review of patient's allergies indicates no known allergies.  Home Medications   Current Outpatient Rx  Name Route Sig Dispense Refill  . OMEPRAZOLE 40 MG PO CPDR Oral Take 1 capsule (40 mg total) by mouth daily. 20 capsule 0    BP 125/86  Pulse 63  Temp 98.3 F (36.8 C) (Oral)  Resp 20  Ht 6' (1.829 m)  Wt 327 lb (148.326 kg)  BMI 44.35 kg/m2  SpO2 98%  LMP 03/10/2012  Physical Exam  Constitutional: She is oriented to person, place, and time. She appears well-developed.  HENT:  Head: Normocephalic and atraumatic.  Eyes: Conjunctivae and EOM are normal. No scleral icterus.  Neck: Neck supple. No thyromegaly present.  Cardiovascular: Normal rate, regular rhythm and normal heart sounds.  Exam reveals no gallop and no friction rub.   No murmur heard. Pulmonary/Chest: Breath sounds normal. No stridor. She has no wheezes. She has no rales. She exhibits no  tenderness.  Abdominal: Soft. She exhibits no distension. There is no tenderness. There is no rebound.  Musculoskeletal: Normal range of motion. She exhibits tenderness. She exhibits no edema.       Tenderness to lumbar spine with positive straight leg raise on the right.   Lymphadenopathy:    She has no cervical adenopathy.  Neurological: She is oriented to person, place, and time. Coordination normal.  Skin: No rash noted. No erythema.  Psychiatric: She has a normal mood and affect. Her behavior is normal.    ED Course  Procedures  (including critical care time)  DIAGNOSTIC STUDIES: Oxygen Saturation is 98% on room air, normal by my interpretation.    COORDINATION OF CARE:  08:03-Discussed planned course of treatment with the patient, who is agreeable at this time.   08:15-Medication Orders: Methylprednisolone sodium succinate (Solumedrol) 125 mg/2 mL injection 125 mg-once; Hydromorphone (Dilaudid) injection 1 mg-once.   Labs Reviewed - No data to display No results found.   No diagnosis found.    MDM    The chart was scribed for me under my direct supervision.  I personally performed the history, physical, and medical decision making and all procedures in the evaluation of this patient.Phyllis Lennert, MD 03/14/12 [phone]

## 2012-12-05 ENCOUNTER — Emergency Department (HOSPITAL_COMMUNITY)
Admission: EM | Admit: 2012-12-05 | Discharge: 2012-12-05 | Disposition: A | Discharge status: Home / Self Care | Payer: Self-pay | Attending: Emergency Medicine | Admitting: Emergency Medicine

## 2012-12-05 ENCOUNTER — Encounter (HOSPITAL_COMMUNITY): Payer: Self-pay | Admitting: *Deleted

## 2012-12-05 DIAGNOSIS — R42 Dizziness and giddiness: Secondary | ICD-10-CM | POA: Insufficient documentation

## 2012-12-05 DIAGNOSIS — H5789 Other specified disorders of eye and adnexa: Secondary | ICD-10-CM | POA: Insufficient documentation

## 2012-12-05 DIAGNOSIS — R112 Nausea with vomiting, unspecified: Secondary | ICD-10-CM | POA: Insufficient documentation

## 2012-12-05 DIAGNOSIS — R51 Headache: Secondary | ICD-10-CM | POA: Insufficient documentation

## 2012-12-05 DIAGNOSIS — F17200 Nicotine dependence, unspecified, uncomplicated: Secondary | ICD-10-CM | POA: Insufficient documentation

## 2012-12-05 LAB — COMPREHENSIVE METABOLIC PANEL
ALT: 21 U/L (ref 0–35)
AST: 20 U/L (ref 0–37)
Albumin: 3.8 g/dL (ref 3.5–5.2)
Alkaline Phosphatase: 102 U/L (ref 39–117)
CO2: 26 mEq/L (ref 19–32)
Chloride: 106 mEq/L (ref 96–112)
GFR calc non Af Amer: >90 mL/min (ref >90)
Potassium: 3.7 mEq/L (ref 3.5–5.1)
Sodium: 140 mEq/L (ref 135–145)
Total Bilirubin: 0.5 mg/dL (ref 0.3–1.2)

## 2012-12-05 LAB — CBC WITH DIFFERENTIAL/PLATELET
Basophils Absolute: 0 10*3/uL (ref 0.0–0.1)
Basophils Relative: 0 % (ref 0–1)
HCT: 39.8 % (ref 36.0–46.0)
Lymphocytes Relative: 30 % (ref 12–46)
MCHC: 33.7 g/dL (ref 30.0–36.0)
Monocytes Absolute: 0.5 10*3/uL (ref 0.1–1.0)
Neutro Abs: 4.4 10*3/uL (ref 1.7–7.7)
Neutrophils Relative %: 62 % (ref 43–77)
Platelets: 269 10*3/uL (ref 150–400)
RDW: 13.1 % (ref 11.5–15.5)
WBC: 7.1 10*3/uL (ref 4.0–10.5)

## 2012-12-05 MED ORDER — METOCLOPRAMIDE HCL 5 MG/ML IJ SOLN
10.0000 mg | Freq: Once | INTRAMUSCULAR | Status: AC
  Administered 2012-12-05: 10 mg via INTRAVENOUS
  Filled 2012-12-05: qty 2

## 2012-12-05 MED ORDER — DIPHENHYDRAMINE HCL 50 MG/ML IJ SOLN
25.0000 mg | Freq: Once | INTRAMUSCULAR | Status: AC
  Administered 2012-12-05: 25 mg via INTRAVENOUS
  Filled 2012-12-05: qty 1

## 2012-12-05 MED ORDER — IBUPROFEN 800 MG PO TABS: 800.0000 mg | ORAL_TABLET | Freq: Three times a day (TID) | ORAL | Status: DC | PRN

## 2012-12-05 MED ORDER — KETOROLAC TROMETHAMINE 30 MG/ML IJ SOLN
30.0000 mg | Freq: Once | INTRAMUSCULAR | Status: AC
  Administered 2012-12-05: 30 mg via INTRAVENOUS
  Filled 2012-12-05: qty 1

## 2012-12-05 NOTE — ED Notes (Signed)
Head pressure, dizziness and vomiting that started last night.

## 2012-12-05 NOTE — ED Provider Notes (Signed)
History    This chart was scribed for Phyllis Lennert, MD by Leone Payor, ED Scribe. This patient was seen in room APA08/APA08 and the patient's care was started 1:05 PM.   CSN: [id number]  Arrival date & time 12/05/12  1037   None     Chief Complaint  Patient presents with  . Headache    Patient is a 32 y.o. female presenting with headaches. The history is provided by the patient. No language interpreter was used.  Headache Pain location:  Frontal Quality: booming, pressure. Radiates to:  Does not radiate Severity currently:  6/10 Severity at highest:  10/10 Onset quality:  Gradual Duration:  1 day Timing:  Constant Progression:  Unchanged Chronicity:  New Relieved by:  None tried Associated symptoms: dizziness, nausea and vomiting   Associated symptoms: no abdominal pain, no back pain, no congestion, no cough, no diarrhea, no fatigue, no fever, no seizures, no sinus pressure and no sore throat     Phyllis Daniels is a 32 y.o. female who presents to the Emergency Department complaining of a new, constant, unchanged HA over the eyes and forhead starting last night 9PM. She describes the pain as pressure and booming. She has associated dizziness, vomiting (2 episodes), watery and itchy eyes. She denies fever, cough, sore throat. Pt is a current everyday smoker but denies alcohol use.    Past Medical History  Diagnosis Date  . Back pain     Past Surgical History  Procedure Laterality Date  . Tubal ligation      No family history on file.  History  Substance Use Topics  . Smoking status: Current Every Day Smoker -- 0.50 packs/day    Types: Cigarettes  . Smokeless tobacco: Not on file  . Alcohol Use: No    No OB history provided.   Review of Systems  Constitutional: Negative for fever, appetite change and fatigue.  HENT: Negative for congestion, sore throat, sinus pressure and ear discharge.   Eyes: Negative for discharge.  Respiratory: Negative for cough.    Cardiovascular: Negative for chest pain.  Gastrointestinal: Positive for nausea and vomiting. Negative for abdominal pain and diarrhea.  Genitourinary: Negative for frequency and hematuria.  Musculoskeletal: Negative for back pain.  Skin: Negative for rash.  Neurological: Positive for dizziness and headaches. Negative for seizures.  Psychiatric/Behavioral: Negative for hallucinations.  All other systems reviewed and are negative.    Allergies  Tuberculin tests and Latex  Home Medications  No current outpatient prescriptions on file.  BP 137/69  Pulse 78  Temp(Src) 98.8 F (37.1 C) (Oral)  Resp 20  Ht 5\' 11"  (1.803 m)  Wt 317 lb (143.79 kg)  BMI 44.23 kg/m2  SpO2 100%  LMP 11/25/2012  Physical Exam  Nursing note and vitals reviewed. Constitutional: She is oriented to person, place, and time. She appears well-developed.  HENT:  Head: Normocephalic.  Eyes: Conjunctivae and EOM are normal. No scleral icterus.  Neck: Neck supple. No thyromegaly present.  Cardiovascular: Normal rate, regular rhythm and normal heart sounds.  Exam reveals no gallop and no friction rub.   No murmur heard. Pulmonary/Chest: Effort normal and breath sounds normal. No stridor. She has no wheezes. She has no rales. She exhibits no tenderness.  Abdominal: Soft. Bowel sounds are normal. She exhibits no distension. There is no tenderness. There is no rebound.  Musculoskeletal: Normal range of motion. She exhibits no edema.  Lymphadenopathy:    She has no cervical adenopathy.  Neurological: She  is oriented to person, place, and time. Coordination normal.  Skin: No rash noted. No erythema.  Psychiatric: She has a normal mood and affect. Her behavior is normal.    ED Course  Procedures (including critical care time)  DIAGNOSTIC STUDIES: Oxygen Saturation is 100% on room air, normal by my interpretation.    COORDINATION OF CARE: 1:14 PM Discussed treatment plan with pt at bedside and pt agreed to  plan.  3:31 PM Upon recheck, pt states she feels much better, her HA has subsided.   Results for orders placed during the hospital encounter of 12/05/12  CBC WITH DIFFERENTIAL      Result Value Range   WBC 7.1  4.0 - 10.5 K/uL   RBC 4.43  3.87 - 5.11 MIL/uL   Hemoglobin 13.4  12.0 - 15.0 g/dL   HCT 96.0  45.4 - 09.8 %   MCV 89.8  78.0 - 100.0 fL   MCH 30.2  26.0 - 34.0 pg   MCHC 33.7  30.0 - 36.0 g/dL   RDW 11.9  14.7 - 82.9 %   Platelets 269  150 - 400 K/uL   Neutrophils Relative 62  43 - 77 %   Neutro Abs 4.4  1.7 - 7.7 K/uL   Lymphocytes Relative 30  12 - 46 %   Lymphs Abs 2.1  0.7 - 4.0 K/uL   Monocytes Relative 7  3 - 12 %   Monocytes Absolute 0.5  0.1 - 1.0 K/uL   Eosinophils Relative 1  0 - 5 %   Eosinophils Absolute 0.1  0.0 - 0.7 K/uL   Basophils Relative 0  0 - 1 %   Basophils Absolute 0.0  0.0 - 0.1 K/uL  COMPREHENSIVE METABOLIC PANEL      Result Value Range   Sodium 140  135 - 145 mEq/L   Potassium 3.7  3.5 - 5.1 mEq/L   Chloride 106  96 - 112 mEq/L   CO2 26  19 - 32 mEq/L   Glucose, Bld 97  70 - 99 mg/dL   BUN 10  6 - 23 mg/dL   Creatinine, Ser 5.62  0.50 - 1.10 mg/dL   Calcium 9.3  8.4 - 13.0 mg/dL   Total Protein 7.5  6.0 - 8.3 g/dL   Albumin 3.8  3.5 - 5.2 g/dL   AST 20  0 - 37 U/L   ALT 21  0 - 35 U/L   Alkaline Phosphatase 102  39 - 117 U/L   Total Bilirubin 0.5  0.3 - 1.2 mg/dL   GFR calc non Af Amer >90  >90 mL/min   GFR calc Af Amer >90  >90 mL/min       Labs Reviewed - No data to display No results found.   No diagnosis found.    MDM        The chart was scribed for me under my direct supervision.  I personally performed the history, physical, and medical decision making and all procedures in the evaluation of this patient.Phyllis Lennert, MD 12/05/12 [phone]

## 2013-02-27 ENCOUNTER — Encounter (HOSPITAL_COMMUNITY): Payer: Self-pay | Admitting: Emergency Medicine

## 2013-02-27 ENCOUNTER — Emergency Department (HOSPITAL_COMMUNITY)
Admission: EM | Admit: 2013-02-27 | Discharge: 2013-02-27 | Disposition: A | Discharge status: Home / Self Care | Payer: Self-pay | Attending: Emergency Medicine | Admitting: Emergency Medicine

## 2013-02-27 DIAGNOSIS — Z91040 Latex allergy status: Secondary | ICD-10-CM | POA: Insufficient documentation

## 2013-02-27 DIAGNOSIS — F17200 Nicotine dependence, unspecified, uncomplicated: Secondary | ICD-10-CM | POA: Insufficient documentation

## 2013-02-27 DIAGNOSIS — K0889 Other specified disorders of teeth and supporting structures: Secondary | ICD-10-CM

## 2013-02-27 DIAGNOSIS — R220 Localized swelling, mass and lump, head: Secondary | ICD-10-CM | POA: Insufficient documentation

## 2013-02-27 DIAGNOSIS — R51 Headache: Secondary | ICD-10-CM | POA: Insufficient documentation

## 2013-02-27 DIAGNOSIS — M549 Dorsalgia, unspecified: Secondary | ICD-10-CM | POA: Insufficient documentation

## 2013-02-27 DIAGNOSIS — K089 Disorder of teeth and supporting structures, unspecified: Secondary | ICD-10-CM | POA: Insufficient documentation

## 2013-02-27 MED ORDER — HYDROCODONE-ACETAMINOPHEN 5-325 MG PO TABS: 1.0000 | ORAL_TABLET | ORAL | Status: DC | PRN

## 2013-02-27 MED ORDER — AMOXICILLIN 500 MG PO CAPS: 500.0000 mg | ORAL_CAPSULE | Freq: Three times a day (TID) | ORAL | Status: DC

## 2013-02-27 MED ORDER — AMOXICILLIN 250 MG PO CAPS
500.0000 mg | ORAL_CAPSULE | Freq: Once | ORAL | Status: AC
  Administered 2013-02-27: 500 mg via ORAL
  Filled 2013-02-27: qty 2

## 2013-02-27 MED ORDER — KETOROLAC TROMETHAMINE 10 MG PO TABS
10.0000 mg | ORAL_TABLET | Freq: Once | ORAL | Status: AC
  Administered 2013-02-27: 10 mg via ORAL
  Filled 2013-02-27: qty 1

## 2013-02-27 MED ORDER — ACETAMINOPHEN 325 MG PO TABS
650.0000 mg | ORAL_TABLET | Freq: Once | ORAL | Status: AC
  Administered 2013-02-27: 650 mg via ORAL
  Filled 2013-02-27: qty 2

## 2013-02-27 NOTE — ED Notes (Signed)
Onset of left lower molar pain with swelling x 2 days   Throbbing

## 2013-02-27 NOTE — ED Provider Notes (Signed)
CSN: [id number]     Arrival date & time 02/27/13  2109 History     First MD Initiated Contact with Patient 02/27/13 2142     Chief Complaint  Patient presents with  . Dental Pain   (Consider location/radiation/quality/duration/timing/severity/associated sxs/prior Treatment) Patient is a 32 y.o. female presenting with tooth pain. The history is provided by the patient.  Dental Pain Location:  Lower Lower teeth location:  18/LL 2nd molar Quality:  Throbbing Severity:  Moderate Onset quality:  Gradual Duration:  2 days Timing:  Intermittent Progression:  Worsening Chronicity:  New Context: dental caries and poor dentition   Relieved by:  Nothing Ineffective treatments:  NSAIDs Associated symptoms: gum swelling and headaches   Associated symptoms: no drooling and no neck pain   Risk factors: smoking     Past Medical History  Diagnosis Date  . Back pain    Past Surgical History  Procedure Laterality Date  . Tubal ligation     No family history on file. History  Substance Use Topics  . Smoking status: Current Every Day Smoker -- 0.50 packs/day    Types: Cigarettes  . Smokeless tobacco: Not on file  . Alcohol Use: No   OB History   Grav Para Term Preterm Abortions TAB SAB Ect Mult Living                 Review of Systems  Constitutional: Negative for activity change.       All ROS Neg except as noted in HPI  HENT: Negative for nosebleeds, drooling and neck pain.   Eyes: Negative for photophobia and discharge.  Respiratory: Negative for cough, shortness of breath and wheezing.   Cardiovascular: Negative for chest pain and palpitations.  Gastrointestinal: Negative for abdominal pain and blood in stool.  Genitourinary: Negative for dysuria, frequency and hematuria.  Musculoskeletal: Positive for back pain. Negative for arthralgias.  Skin: Negative.   Neurological: Positive for headaches. Negative for dizziness, seizures and speech difficulty.    Psychiatric/Behavioral: Negative for hallucinations and confusion.    Allergies  Tuberculin tests and Latex  Home Medications   Current Outpatient Rx  Name  Route  Sig  Dispense  Refill  . ibuprofen (ADVIL,MOTRIN) 800 MG tablet   Oral   Take 1 tablet (800 mg total) by mouth every 8 (eight) hours as needed for pain.   15 tablet   0    BP 140/97  Pulse 99  Temp(Src) 98.7 F (37.1 C) (Oral)  Resp 20  Ht 6' (1.829 m)  Wt 321 lb (145.605 kg)  BMI 43.53 kg/m2  SpO2 100%  LMP 02/06/2013 Physical Exam  Nursing note and vitals reviewed. Constitutional: She is oriented to person, place, and time. She appears well-developed and well-nourished.  Non-toxic appearance.  HENT:  Head: Normocephalic.  Right Ear: Tympanic membrane and external ear normal.  Left Ear: Tympanic membrane and external ear normal.  Mouth/Throat:    Eyes: EOM and lids are normal. Pupils are equal, round, and reactive to light.  Neck: Normal range of motion. Neck supple. Carotid bruit is not present.  Cardiovascular: Normal rate, regular rhythm, normal heart sounds, intact distal pulses and normal pulses.   Pulmonary/Chest: Breath sounds normal. No respiratory distress.  Abdominal: Soft. Bowel sounds are normal. There is no tenderness. There is no guarding.  Musculoskeletal: Normal range of motion.  Lymphadenopathy:       Head (right side): No submandibular adenopathy present.       Head (left side): No  submandibular adenopathy present.    She has no cervical adenopathy.  Neurological: She is alert and oriented to person, place, and time. She has normal strength. No cranial nerve deficit or sensory deficit.  Skin: Skin is warm and dry.  Psychiatric: She has a normal mood and affect. Her speech is normal.    ED Course   Procedures (including critical care time)  Labs Reviewed - No data to display No results found. No diagnosis found.  MDM  **I have reviewed nursing notes, vital signs, and all  appropriate lab and imaging results for this patient.* Pt given dental resource sheet.  Rx for amoxil, and norco given. Pt to use ibuprofen q6h.   Kathie Dike, PA-C 02/27/13 2231

## 2013-02-27 NOTE — ED Notes (Signed)
Swelling of left jaw

## 2013-02-28 NOTE — ED Provider Notes (Signed)
Medical screening examination/treatment/procedure(s) were performed by non-physician practitioner and as supervising physician I was immediately available for consultation/collaboration. Oliviagrace Crisanti, MD, FACEP   Donique Hammonds L Yvanna Vidas, MD 02/28/13 0123 

## 2013-10-02 ENCOUNTER — Emergency Department (HOSPITAL_COMMUNITY)
Admission: EM | Admit: 2013-10-02 | Discharge: 2013-10-02 | Disposition: A | Discharge status: Home / Self Care | Payer: Self-pay | Attending: Emergency Medicine | Admitting: Emergency Medicine

## 2013-10-02 ENCOUNTER — Encounter (HOSPITAL_COMMUNITY): Payer: Self-pay | Admitting: Emergency Medicine

## 2013-10-02 DIAGNOSIS — F17200 Nicotine dependence, unspecified, uncomplicated: Secondary | ICD-10-CM | POA: Insufficient documentation

## 2013-10-02 DIAGNOSIS — Z91040 Latex allergy status: Secondary | ICD-10-CM | POA: Insufficient documentation

## 2013-10-02 DIAGNOSIS — Y9210 Unspecified residential institution as the place of occurrence of the external cause: Secondary | ICD-10-CM | POA: Insufficient documentation

## 2013-10-02 DIAGNOSIS — Y990 Civilian activity done for income or pay: Secondary | ICD-10-CM | POA: Insufficient documentation

## 2013-10-02 DIAGNOSIS — S39012A Strain of muscle, fascia and tendon of lower back, initial encounter: Secondary | ICD-10-CM

## 2013-10-02 DIAGNOSIS — X500XXA Overexertion from strenuous movement or load, initial encounter: Secondary | ICD-10-CM | POA: Insufficient documentation

## 2013-10-02 DIAGNOSIS — S335XXA Sprain of ligaments of lumbar spine, initial encounter: Secondary | ICD-10-CM | POA: Insufficient documentation

## 2013-10-02 DIAGNOSIS — Y93F2 Activity, caregiving, lifting: Secondary | ICD-10-CM | POA: Insufficient documentation

## 2013-10-02 DIAGNOSIS — G8929 Other chronic pain: Secondary | ICD-10-CM | POA: Insufficient documentation

## 2013-10-02 MED ORDER — PREDNISONE 50 MG PO TABS
60.0000 mg | ORAL_TABLET | Freq: Once | ORAL | Status: AC
  Administered 2013-10-02: 60 mg via ORAL
  Filled 2013-10-02 (×2): qty 1

## 2013-10-02 MED ORDER — KETOROLAC TROMETHAMINE 60 MG/2ML IM SOLN
60.0000 mg | Freq: Once | INTRAMUSCULAR | Status: AC
  Administered 2013-10-02: 60 mg via INTRAMUSCULAR
  Filled 2013-10-02: qty 2

## 2013-10-02 MED ORDER — PREDNISONE 10 MG PO TABS: ORAL_TABLET | ORAL | Status: DC

## 2013-10-02 MED ORDER — HYDROCODONE-ACETAMINOPHEN 5-325 MG PO TABS: 1.0000 | ORAL_TABLET | ORAL | Status: DC | PRN

## 2013-10-02 NOTE — Discharge Instructions (Signed)
Back Pain, Adult Back pain is very common. The pain often gets better over time. The cause of back pain is usually not dangerous. Most people can learn to manage their back pain on their own.  HOME CARE   Stay active. Start with short walks on flat ground if you can. Try to walk farther each day.  Do not sit, drive, or stand in one place for more than 30 minutes. Do not stay in bed.  Do not avoid exercise or work. Activity can help your back heal faster.  Be careful when you bend or lift an object. Bend at your knees, keep the object close to you, and do not twist.  Sleep on a firm mattress. Lie on your side, and bend your knees. If you lie on your back, put a pillow under your knees.  Only take medicines as told by your doctor.  Put ice on the injured area.  Put ice in a plastic bag.  Place a towel between your skin and the bag.  Leave the ice on for 15-20 minutes, 03-04 times a day for the first 2 to 3 days. After that, you can switch between ice and heat packs.  Ask your doctor about back exercises or massage.  Avoid feeling anxious or stressed. Find good ways to deal with stress, such as exercise. GET HELP RIGHT AWAY IF:   Your pain does not go away with rest or medicine.  Your pain does not go away in 1 week.  You have new problems.  You do not feel well.  The pain spreads into your legs.  You cannot control when you poop (bowel movement) or pee (urinate).  Your arms or legs feel weak or lose feeling (numbness).  You feel sick to your stomach (nauseous) or throw up (vomit).  You have belly (abdominal) pain.  You feel like you may pass out (faint). MAKE SURE YOU:   Understand these instructions.  Will watch your condition.  Will get help right away if you are not doing well or get worse. Document Released: 01/06/2008 Document Revised: 10/12/2011 Document Reviewed: 12/08/2010 [facility] Patient Information 2014 [employer], [place].   Take the medicines  prescribed.  Take your next dose of prednisone tomorrow morning.   Do not drive within 4 hours of taking hydrocodone as this will make you drowsy.  Avoid lifting,  Bending,  Twisting or any other activity that worsens your pain over the next week.  Apply an  icepack  to your lower back for 10-15 minutes every 2 hours for the next 2 days.  You should get rechecked if your symptoms are not better over the next 5 days,  Or you develop increased pain,  Weakness in your leg(s) or loss of bladder or bowel function - these are symptoms of a worse injury.

## 2013-10-02 NOTE — ED Provider Notes (Signed)
Medical screening examination/treatment/procedure(s) were performed by non-physician practitioner and as supervising physician I was immediately available for consultation/collaboration.   EKG Interpretation None       Doug SouSam Randle Shatzer, MD 10/02/13 1450

## 2013-10-02 NOTE — ED Notes (Signed)
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

## 2013-10-02 NOTE — ED Provider Notes (Signed)
CSN: [id number]     Arrival date & time 10/02/13  1209 History   First MD Initiated Contact with Patient 10/02/13 1228     Chief Complaint  Patient presents with  . Back Pain     (Consider location/radiation/quality/duration/timing/severity/associated sxs/prior Treatment) HPI Comments: Phyllis Daniels is a 33 y.o. Female presenting with acute on chronic low back pain which has which has been present for the past day.  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   Patient denies any new injury specifically.  There is no radiation,weakness or numbness in the lower extremities and no urinary or bowel retention or incontinence.  Patient does not have a history of cancer or IVDU.  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 The patient has tried the followed medicines and/or treatments without relief of pain: Tylenol.      The history is provided by the patient.    Past Medical History  Diagnosis Date  . Back pain    Past Surgical History  Procedure Laterality Date  . Tubal ligation     History reviewed. No pertinent family history. History  Substance Use Topics  . Smoking status: Current Every Day Smoker -- 0.50 packs/day    Types: Cigarettes  . Smokeless tobacco: Not on file  . Alcohol Use: No   OB History   Grav Para Term Preterm Abortions TAB SAB Ect Mult Living                 Review of Systems  Constitutional: Negative for fever.  Respiratory: Negative for shortness of breath.   Cardiovascular: Negative for chest pain and leg swelling.  Gastrointestinal: Negative for abdominal pain, constipation and abdominal distention.  Genitourinary: Negative for dysuria, urgency, frequency, flank pain and difficulty urinating.  Musculoskeletal: Positive for back pain. Negative for gait problem and joint swelling.  Skin: Negative for rash.  Neurological: Negative for  weakness and numbness.      Allergies  Tuberculin tests and Latex  Home Medications   Current Outpatient Rx  Name  Route  Sig  Dispense  Refill  . acetaminophen (TYLENOL) 500 MG tablet   Oral   Take 1,000 mg by mouth daily as needed for moderate pain.         Marland Kitchen. HYDROcodone-acetaminophen (NORCO/VICODIN) 5-325 MG per tablet   Oral   Take 1 tablet by mouth every 4 (four) hours as needed for moderate pain.   15 tablet   0   . predniSONE (DELTASONE) 10 MG tablet      6, 5, 4, 3, 2 then 1 tablet by mouth daily for 6 days total.   21 tablet   0    BP 134/90  Pulse 86  Temp(Src) 98.5 F (36.9 C) (Oral)  Resp 18  Ht 5' 11.5" (1.816 m)  Wt 341 lb (154.677 kg)  BMI 46.90 kg/m2  SpO2 98%  LMP 09/19/2013 Physical Exam  Nursing note and vitals reviewed. Constitutional: She appears well-developed and well-nourished.  HENT:  Head: Normocephalic.  Eyes: Conjunctivae are normal.  Neck: Normal range of motion. Neck supple.  Cardiovascular: Normal rate and intact distal pulses.   Pulses:      Dorsalis pedis pulses are 2+ on the right side, and 2+ on the left side.  Pedal pulses normal.  Pulmonary/Chest: Effort normal.  Abdominal: Soft. Bowel sounds are normal. She  exhibits no distension and no mass.  Musculoskeletal: Normal range of motion. She exhibits no edema.       Lumbar back: She exhibits tenderness. She exhibits no swelling, no edema and no spasm.  Lumbar and paralumbar tenderness to palpation.  Neurological: She is alert. She has normal strength. She displays no atrophy and no tremor. No sensory deficit. Gait normal.  Reflex Scores:      Patellar reflexes are 2+ on the right side and 2+ on the left side.      Achilles reflexes are 2+ on the right side and 2+ on the left side. No strength deficit noted in hip and knee flexor and extensor muscle groups.  Ankle flexion and extension intact.  Skin: Skin is warm and dry.  Psychiatric: She has a normal mood and affect.     ED Course  Procedures (including critical care time) Labs Review Labs Reviewed - No data to display Imaging Review No results found.   EKG Interpretation None      MDM   Final diagnoses:  Lumbar strain    Patient was given Toradol 60 mg IM and started on a prednisone taper today.  Prescribed hydrocodone for pain relief.  Encouraged to minimize activities that worsen her pain, but to stay as active as she comfortably can.  No neuro deficit on exam or by history to suggest emergent or surgical presentation.  Also discussed worsened sx that should prompt immediate re-evaluation including distal weakness, bowel/bladder retention/incontinence.          Burgess Amor, PA-C 10/02/13 1249

## 2014-02-20 ENCOUNTER — Emergency Department (HOSPITAL_COMMUNITY)
Admission: EM | Admit: 2014-02-20 | Discharge: 2014-02-20 | Disposition: A | Discharge status: Home / Self Care | Payer: Self-pay | Attending: Emergency Medicine | Admitting: Emergency Medicine

## 2014-02-20 ENCOUNTER — Encounter (HOSPITAL_COMMUNITY): Payer: Self-pay | Admitting: Emergency Medicine

## 2014-02-20 DIAGNOSIS — Z91040 Latex allergy status: Secondary | ICD-10-CM | POA: Insufficient documentation

## 2014-02-20 DIAGNOSIS — Z79899 Other long term (current) drug therapy: Secondary | ICD-10-CM | POA: Insufficient documentation

## 2014-02-20 DIAGNOSIS — M5441 Lumbago with sciatica, right side: Secondary | ICD-10-CM

## 2014-02-20 DIAGNOSIS — M5430 Sciatica, unspecified side: Secondary | ICD-10-CM | POA: Insufficient documentation

## 2014-02-20 DIAGNOSIS — F17200 Nicotine dependence, unspecified, uncomplicated: Secondary | ICD-10-CM | POA: Insufficient documentation

## 2014-02-20 DIAGNOSIS — IMO0002 Reserved for concepts with insufficient information to code with codable children: Secondary | ICD-10-CM | POA: Insufficient documentation

## 2014-02-20 MED ORDER — HYDROCODONE-ACETAMINOPHEN 5-325 MG PO TABS
2.0000 | ORAL_TABLET | Freq: Once | ORAL | Status: AC
  Administered 2014-02-20: 2 via ORAL
  Filled 2014-02-20: qty 2

## 2014-02-20 MED ORDER — DEXAMETHASONE SODIUM PHOSPHATE 4 MG/ML IJ SOLN
8.0000 mg | Freq: Once | INTRAMUSCULAR | Status: AC
  Administered 2014-02-20: 8 mg via INTRAMUSCULAR
  Filled 2014-02-20: qty 2

## 2014-02-20 MED ORDER — ONDANSETRON HCL 4 MG PO TABS
4.0000 mg | ORAL_TABLET | Freq: Once | ORAL | Status: AC
  Administered 2014-02-20: 4 mg via ORAL
  Filled 2014-02-20: qty 1

## 2014-02-20 MED ORDER — METHOCARBAMOL 500 MG PO TABS
1000.0000 mg | ORAL_TABLET | Freq: Once | ORAL | Status: AC
  Administered 2014-02-20: 1000 mg via ORAL
  Filled 2014-02-20: qty 2

## 2014-02-20 MED ORDER — BACLOFEN 10 MG PO TABS: 10.0000 mg | ORAL_TABLET | Freq: Three times a day (TID) | ORAL | Status: AC

## 2014-02-20 MED ORDER — HYDROCODONE-ACETAMINOPHEN 5-325 MG PO TABS: 1.0000 | ORAL_TABLET | ORAL | Status: DC | PRN

## 2014-02-20 MED ORDER — DEXAMETHASONE 4 MG PO TABS: ORAL_TABLET | ORAL | Status: DC

## 2014-02-20 MED ORDER — KETOROLAC TROMETHAMINE 60 MG/2ML IM SOLN
60.0000 mg | Freq: Once | INTRAMUSCULAR | Status: AC
  Administered 2014-02-20: 60 mg via INTRAMUSCULAR
  Filled 2014-02-20: qty 2

## 2014-02-20 NOTE — ED Provider Notes (Signed)
CSN: [id number]     Arrival date & time 02/20/14  1759 History  This chart was scribed for [facility]. Danae Orleans  working with Hurman Horn, MD by Ashley Jacobs, ED scribe. This patient was seen in room APFT23/APFT23 and the patient's care was started at 7:35 PM.   First MD Initiated Contact with Patient 02/20/14 1933     Chief Complaint  Patient presents with  . Back Pain     (Consider location/radiation/quality/duration/timing/severity/associated sxs/prior Treatment) Patient is a 33 y.o. female presenting with back pain. The history is provided by the patient and medical records. No language interpreter was used.  Back Pain Location:  Lumbar spine Radiates to:  Does not radiate Pain severity:  Severe Pain is:  Same all the time Onset quality:  Sudden Duration:  4 hours Timing:  Constant Progression:  Unchanged Chronicity:  New Worsened by:  Nothing tried Risk factors: no recent surgery    HPI Comments: Phyllis Daniels is a 33 y.o. female who presents to the Emergency Department complaining reoccurring, chronic, severe back pain for the past four hours. Pt has a hx of slipped disc nerve pain onset 2005. Denies prior back surgery. Denies recent injury or trauma. The pain is worse with movement and with palpation.  Pt mentions the only thing that seems to help the reoccurring pain is the pain medication that "starts with a T". 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 No urinary incontinence. No bowel incontinence.  Past Medical History  Diagnosis Date  . Back pain    Past Surgical History  Procedure Laterality Date  . Tubal ligation     History reviewed. No pertinent family history. History  Substance Use Topics  . Smoking status: Current Every Day Smoker -- 0.50 packs/day    Types: Cigarettes  . Smokeless tobacco: Not on file  . Alcohol Use: No   OB History   Grav Para Term Preterm Abortions TAB SAB  Ect Mult Living                 Review of Systems  Gastrointestinal:       No bowel incontinence.    Genitourinary: Negative for enuresis.  Musculoskeletal: Positive for back pain.  All other systems reviewed and are negative.     Allergies  Tuberculin tests and Latex  Home Medications   Prior to Admission medications   Medication Sig Start Date End Date Taking? Authorizing Provider  acetaminophen (TYLENOL) 500 MG tablet Take 1,000 mg by mouth daily as needed for moderate pain.    Historical Provider, MD  HYDROcodone-acetaminophen (NORCO/VICODIN) 5-325 MG per tablet Take 1 tablet by mouth every 4 (four) hours as needed for moderate pain. 10/02/13   Burgess Amor, PA-C  predniSONE (DELTASONE) 10 MG tablet 6, 5, 4, 3, 2 then 1 tablet by mouth daily for 6 days total. 10/02/13   Burgess Amor, PA-C   BP 120/69  Pulse 84  Temp(Src) 98.1 F (36.7 C) (Oral)  Resp 18  SpO2 97%  LMP 02/13/2014 Physical Exam  Nursing note and vitals reviewed. Constitutional: She is oriented to person, place, and time. She appears well-developed and well-nourished.  HENT:  Head: Normocephalic.  Eyes: Pupils are equal, round, and reactive to light.  Neck: Normal range of motion.  Cardiovascular: Normal rate.   Pulmonary/Chest: Effort normal.  Musculoskeletal: She exhibits tenderness.  paraspinal tenderness in the lumbar region, right greater than  left.   Neurological: She is alert and oriented to person, place, and time.  No motor sensory deficit of the lower extremities.   Skin: Skin is warm and dry. She is not diaphoretic.    ED Course  Procedures (including critical care time) DIAGNOSTIC STUDIES: Oxygen Saturation is 97% on room air, normal by my interpretation.    COORDINATION OF CARE:  7:39 PM Discussed course of care with pt which includes pain medication. Pt understands and agrees.   Labs Review Labs Reviewed - No data to display  Imaging Review No results found.   EKG  Interpretation None      MDM The patient has had problems with her lower back off and on for nearly 10 years. She has not had any operations, but has had injections and various physical therapies. The patient has a physically demanding job, working with elderly residents. She reinjured her back approximately 3 PM today. No loss of bowel or bladder function.  Patient will be treated with baclofen, Decadron, and Norco. Patient is to followup with her primary physician if any changes or problems.    Final diagnoses:  None    *I have reviewed nursing notes, vital signs, and all appropriate lab and imaging results for this patient.** **I personally performed the services described in this documentation, which was scribed in my presence. The recorded information has been reviewed and is accurate.Kathie Dike*    Jacorie Ernsberger M Bingham Millette, PA-C 02/20/14 2023

## 2014-02-20 NOTE — ED Notes (Signed)
Back starting hurting at 3 pm today.  Rates pain 9. Have not taken any pain medication.

## 2014-02-20 NOTE — Discharge Instructions (Signed)
Please rest your back is much as possible. Please apply heat to your lower back. Please use Decadron, and baclofen daily. Please use Norco for pain if needed. Norco and baclofen may cause drowsiness, please use with caution. Back Pain, Adult Back pain is very common. The pain often gets better over time. The cause of back pain is usually not dangerous. Most people can learn to manage their back pain on their own.  HOME CARE   Stay active. Start with short walks on flat ground if you can. Try to walk farther each day.  Do not sit, drive, or stand in one place for more than 30 minutes. Do not stay in bed.  Do not avoid exercise or work. Activity can help your back heal faster.  Be careful when you bend or lift an object. Bend at your knees, keep the object close to you, and do not twist.  Sleep on a firm mattress. Lie on your side, and bend your knees. If you lie on your back, put a pillow under your knees.  Only take medicines as told by your doctor.  Put ice on the injured area.  Put ice in a plastic bag.  Place a towel between your skin and the bag.  Leave the ice on for 15-20 minutes, 03-04 times a day for the first 2 to 3 days. After that, you can switch between ice and heat packs.  Ask your doctor about back exercises or massage.  Avoid feeling anxious or stressed. Find good ways to deal with stress, such as exercise. GET HELP RIGHT AWAY IF:   Your pain does not go away with rest or medicine.  Your pain does not go away in 1 week.  You have new problems.  You do not feel well.  The pain spreads into your legs.  You cannot control when you poop (bowel movement) or pee (urinate).  Your arms or legs feel weak or lose feeling (numbness).  You feel sick to your stomach (nauseous) or throw up (vomit).  You have belly (abdominal) pain.  You feel like you may pass out (faint). MAKE SURE YOU:   Understand these instructions.  Will watch your condition.  Will get help  right away if you are not doing well or get worse. Document Released: 01/06/2008 Document Revised: 10/12/2011 Document Reviewed: 12/08/2010 [facility] Patient Information 2015 [place], [place]. This information is not intended to replace advice given to you by your health care provider. Make sure you discuss any questions you have with your health care provider.

## 2014-02-20 NOTE — ED Notes (Signed)
Pt verbalized understanding of no driving within 4 hours of taking pain med due to med causes drowsiness  

## 2014-02-24 NOTE — ED Provider Notes (Signed)
Medical screening examination/treatment/procedure(s) were performed by non-physician practitioner and as supervising physician I was immediately available for consultation/collaboration.   EKG Interpretation None       Hurman HornJohn M Bula Cavalieri, MD 02/24/14 [phone]

## 2014-06-28 ENCOUNTER — Emergency Department (HOSPITAL_COMMUNITY)
Admission: EM | Admit: 2014-06-28 | Discharge: 2014-06-28 | Disposition: A | Discharge status: Home / Self Care | Payer: Self-pay | Attending: Emergency Medicine | Admitting: Emergency Medicine

## 2014-06-28 ENCOUNTER — Encounter (HOSPITAL_COMMUNITY): Payer: Self-pay

## 2014-06-28 DIAGNOSIS — M5431 Sciatica, right side: Secondary | ICD-10-CM | POA: Insufficient documentation

## 2014-06-28 DIAGNOSIS — Z720 Tobacco use: Secondary | ICD-10-CM | POA: Insufficient documentation

## 2014-06-28 DIAGNOSIS — Z91040 Latex allergy status: Secondary | ICD-10-CM | POA: Insufficient documentation

## 2014-06-28 MED ORDER — CYCLOBENZAPRINE HCL 10 MG PO TABS: 10.0000 mg | ORAL_TABLET | Freq: Three times a day (TID) | ORAL | Status: DC | PRN

## 2014-06-28 MED ORDER — DEXAMETHASONE SODIUM PHOSPHATE 10 MG/ML IJ SOLN
10.0000 mg | Freq: Once | INTRAMUSCULAR | Status: AC
Start: 2014-06-28 — End: 2014-06-28
  Administered 2014-06-28: 10 mg via INTRAMUSCULAR
  Filled 2014-06-28: qty 1

## 2014-06-28 MED ORDER — KETOROLAC TROMETHAMINE 60 MG/2ML IM SOLN
60.0000 mg | Freq: Once | INTRAMUSCULAR | Status: AC
  Administered 2014-06-28: 60 mg via INTRAMUSCULAR
  Filled 2014-06-28: qty 2

## 2014-06-28 MED ORDER — PREDNISONE 20 MG PO TABS: ORAL_TABLET | ORAL | Status: DC

## 2014-06-28 MED ORDER — HYDROCODONE-ACETAMINOPHEN 5-325 MG PO TABS: 2.0000 | ORAL_TABLET | ORAL | Status: DC | PRN

## 2014-06-28 NOTE — Discharge Instructions (Signed)
Sciatica Sciatica is pain, weakness, numbness, or tingling along the path of the sciatic nerve. The nerve starts in the lower back and runs down the back of each leg. The nerve controls the muscles in the lower leg and in the back of the knee, while also providing sensation to the back of the thigh, lower leg, and the sole of your foot. Sciatica is a symptom of another medical condition. For instance, nerve damage or certain conditions, such as a herniated disk or bone spur on the spine, pinch or put pressure on the sciatic nerve. This causes the pain, weakness, or other sensations normally associated with sciatica. Generally, sciatica only affects one side of the body. CAUSES   Herniated or slipped disc.  Degenerative disk disease.  A pain disorder involving the narrow muscle in the buttocks (piriformis syndrome).  Pelvic injury or fracture.  Pregnancy.  Tumor (rare). SYMPTOMS  Symptoms can vary from mild to very severe. The symptoms usually travel from the low back to the buttocks and down the back of the leg. Symptoms can include:  Mild tingling or dull aches in the lower back, leg, or hip.  Numbness in the back of the calf or sole of the foot.  Burning sensations in the lower back, leg, or hip.  Sharp pains in the lower back, leg, or hip.  Leg weakness.  Severe back pain inhibiting movement. These symptoms may get worse with coughing, sneezing, laughing, or prolonged sitting or standing. Also, being overweight may worsen symptoms. DIAGNOSIS  Your caregiver will perform a physical exam to look for common symptoms of sciatica. He or she may ask you to do certain movements or activities that would trigger sciatic nerve pain. Other tests may be performed to find the cause of the sciatica. These may include:  Blood tests.  X-rays.  Imaging tests, such as an MRI or CT scan. TREATMENT  Treatment is directed at the cause of the sciatic pain. Sometimes, treatment is not necessary  and the pain and discomfort goes away on its own. If treatment is needed, your caregiver may suggest:  Over-the-counter medicines to relieve pain.  Prescription medicines, such as anti-inflammatory medicine, muscle relaxants, or narcotics.  Applying heat or ice to the painful area.  Steroid injections to lessen pain, irritation, and inflammation around the nerve.  Reducing activity during periods of pain.  Exercising and stretching to strengthen your abdomen and improve flexibility of your spine. Your caregiver may suggest losing weight if the extra weight makes the back pain worse.  Physical therapy.  Surgery to eliminate what is pressing or pinching the nerve, such as a bone spur or part of a herniated disk. HOME CARE INSTRUCTIONS   Only take over-the-counter or prescription medicines for pain or discomfort as directed by your caregiver.  Apply ice to the affected area for 20 minutes, 3-4 times a day for the first 48-72 hours. Then try heat in the same way.  Exercise, stretch, or perform your usual activities if these do not aggravate your pain.  Attend physical therapy sessions as directed by your caregiver.  Keep all follow-up appointments as directed by your caregiver.  Do not wear high heels or shoes that do not provide proper support.  Check your mattress to see if it is too soft. A firm mattress may lessen your pain and discomfort. SEEK IMMEDIATE MEDICAL CARE IF:   You lose control of your bowel or bladder (incontinence).  You have increasing weakness in the lower back, pelvis, buttocks,   or legs.  You have redness or swelling of your back.  You have a burning sensation when you urinate.  You have pain that gets worse when you lie down or awakens you at night.  Your pain is worse than you have experienced in the past.  Your pain is lasting longer than 4 weeks.  You are suddenly losing weight without reason. MAKE SURE YOU:  Understand these  instructions.  Will watch your condition.  Will get help right away if you are not doing well or get worse. Document Released: 07/14/2001 Document Revised: 01/19/2012 Document Reviewed: 11/29/2011 Exit[employer] Patient Information 2015 [employer]. This information is not intended to replace advice given to you by your health care provider. Make sure you discuss any questions you have with your health care provider.  

## 2014-06-28 NOTE — ED Provider Notes (Signed)
CSN: [id number]     Arrival date & time 06/28/14  [id number] History  This chart was scribed for Gilda Creasehristopher J. Georganna Maxson, * by Richarda Overlieichard Holland, ED Scribe. This patient was seen in room APA18/APA18 and the patient's care was started 8:59 AM.    Chief Complaint  Patient presents with  . Back Pain   Patient is a 33 y.o. female presenting with back pain. The history is provided by the patient. No language interpreter was used.  Back Pain  HPI Comments: Phyllis Daniels is a 33 y.o. female with a history of chronic back pain who presents to the Emergency Department complaining of right, lower sided back pain. She states the pain radiates down her right leg and into her right foot. Pt states it started hurting her at work last night and says she lifts at work. She denies any known injury. She reports similar prior episodes and says she normally receives shots for her pain. She denies any urinary incontinence or loss of sensation. She reports no alleviating factors at this time. She reports she smokes 0.5ppd.   Past Medical History  Diagnosis Date  . Back pain    Past Surgical History  Procedure Laterality Date  . Tubal ligation     No family history on file. History  Substance Use Topics  . Smoking status: Current Every Day Smoker -- 0.50 packs/day    Types: Cigarettes  . Smokeless tobacco: Not on file  . Alcohol Use: No   OB History    No data available     Review of Systems  Genitourinary: Negative for urgency.  Musculoskeletal: Positive for back pain.  All other systems reviewed and are negative.   Allergies  Tuberculin tests and Latex  Home Medications   Prior to Admission medications   Medication Sig Start Date End Date Taking? Authorizing Provider  acetaminophen (TYLENOL) 500 MG tablet Take 1,000 mg by mouth daily as needed for moderate pain.    Historical Provider, MD  cyclobenzaprine (FLEXERIL) 10 MG tablet Take 1 tablet (10 mg total) by mouth 3 (three) times daily as needed  for muscle spasms. 06/28/14   Gilda Creasehristopher J. Rhyan Wolters, MD  HYDROcodone-acetaminophen (NORCO/VICODIN) 5-325 MG per tablet Take 2 tablets by mouth every 4 (four) hours as needed for moderate pain. 06/28/14   Gilda Creasehristopher J. Harold Moncus, MD  predniSONE (DELTASONE) 20 MG tablet 3 tabs po daily x 3 days, then 2 tabs x 3 days, then 1.5 tabs x 3 days, then 1 tab x 3 days, then 0.5 tabs x 3 days 06/28/14   Gilda Creasehristopher J. Carston Riedl, MD   BP 119/82 mmHg  Pulse 77  Temp(Src) 98.1 F (36.7 C) (Oral)  Resp 20  Ht 6' (1.829 m)  Wt 324 lb (146.965 kg)  BMI 43.93 kg/m2  SpO2 100% Physical Exam  Constitutional: She is oriented to person, place, and time. She appears well-developed and well-nourished. No distress.  HENT:  Head: Normocephalic and atraumatic.  Right Ear: Hearing normal.  Left Ear: Hearing normal.  Nose: Nose normal.  Mouth/Throat: Oropharynx is clear and moist and mucous membranes are normal.  Eyes: Conjunctivae and EOM are normal. Pupils are equal, round, and reactive to light.  Neck: Normal range of motion. Neck supple.  Cardiovascular: Regular rhythm, S1 normal and S2 normal.  Exam reveals no gallop and no friction rub.   No murmur heard. Pulmonary/Chest: Effort normal and breath sounds normal. No respiratory distress. She exhibits no tenderness.  Abdominal: Soft. Normal appearance and bowel sounds  are normal. There is no hepatosplenomegaly. There is no tenderness. There is no rebound, no guarding, no tenderness at McBurney's point and negative Murphy's sign. No hernia.  Musculoskeletal: Normal range of motion.  Tender in right paraspinal and sciatica area. No midline tenderness.   Neurological: She is alert and oriented to person, place, and time. She has normal strength. No cranial nerve deficit or sensory deficit. Coordination normal. GCS eye subscore is 4. GCS verbal subscore is 5. GCS motor subscore is 6.  Skin: Skin is warm, dry and intact. No rash noted. No cyanosis.  Psychiatric: She  has a normal mood and affect. Her speech is normal and behavior is normal. Thought content normal.  Nursing note and vitals reviewed.   ED Course  Procedures  DIAGNOSTIC STUDIES: Oxygen Saturation is 100% on RA, normal by my interpretation.    COORDINATION OF CARE: 9:02 AM Discussed treatment plan with pt at bedside and pt agreed to plan.   Labs Review Labs Reviewed - No data to display  Imaging Review No results found.   EKG Interpretation None      MDM   Final diagnoses:  Sciatica, right   Presents to the ER for evaluation of low back pain. Patient reports a history of previous episodes of sciatica with similar symptoms. Symptoms began last night at work. She is complaining of constant pain in the right lower back which radiates down the right leg. No loss of strength or sensation in the legs. No change in bowel or bladder function. Patient administered Toradol and Decadron. She will be prescribed prednisone, Vicodin, Flexeril. She has had normal neurologic exam and has had similar symptoms in the past, no imaging or workup is necessary.  I personally performed the services described in this documentation, which was scribed in my presence. The recorded information has been reviewed and is accurate.      Gilda Creasehristopher J. Shaheen Mende, MD 06/28/14 1259

## 2014-06-28 NOTE — ED Notes (Signed)
Pt reports has a pinched nerve in lower back and back pain flares up intermittently.  Reports started hurting last night at work.  Reports radiates down r leg.

## 2014-06-28 NOTE — ED Notes (Signed)
Patient placed in bed, Verlon AuLeslie RN triaging patient at this time.

## 2014-07-02 ENCOUNTER — Emergency Department (HOSPITAL_COMMUNITY)
Admission: EM | Admit: 2014-07-02 | Discharge: 2014-07-02 | Disposition: A | Discharge status: Home / Self Care | Payer: Medicaid Other | Attending: Emergency Medicine | Admitting: Emergency Medicine

## 2014-07-02 ENCOUNTER — Encounter (HOSPITAL_COMMUNITY): Payer: Self-pay | Admitting: *Deleted

## 2014-07-02 ENCOUNTER — Emergency Department (HOSPITAL_COMMUNITY): Payer: Medicaid Other

## 2014-07-02 DIAGNOSIS — M5416 Radiculopathy, lumbar region: Secondary | ICD-10-CM | POA: Diagnosis not present

## 2014-07-02 DIAGNOSIS — M5136 Other intervertebral disc degeneration, lumbar region: Secondary | ICD-10-CM | POA: Diagnosis not present

## 2014-07-02 DIAGNOSIS — Z79899 Other long term (current) drug therapy: Secondary | ICD-10-CM | POA: Insufficient documentation

## 2014-07-02 DIAGNOSIS — R200 Anesthesia of skin: Secondary | ICD-10-CM | POA: Diagnosis not present

## 2014-07-02 DIAGNOSIS — R202 Paresthesia of skin: Secondary | ICD-10-CM

## 2014-07-02 DIAGNOSIS — Z91040 Latex allergy status: Secondary | ICD-10-CM | POA: Diagnosis not present

## 2014-07-02 DIAGNOSIS — Z7952 Long term (current) use of systemic steroids: Secondary | ICD-10-CM | POA: Diagnosis not present

## 2014-07-02 DIAGNOSIS — Z3202 Encounter for pregnancy test, result negative: Secondary | ICD-10-CM | POA: Diagnosis not present

## 2014-07-02 DIAGNOSIS — M545 Low back pain: Secondary | ICD-10-CM | POA: Diagnosis present

## 2014-07-02 DIAGNOSIS — Z720 Tobacco use: Secondary | ICD-10-CM | POA: Insufficient documentation

## 2014-07-02 LAB — POC URINE PREG, ED: Preg Test, Ur: NEGATIVE

## 2014-07-02 MED ORDER — HYDROMORPHONE HCL 1 MG/ML IJ SOLN
1.0000 mg | Freq: Once | INTRAMUSCULAR | Status: AC
  Administered 2014-07-02: 1 mg via INTRAMUSCULAR
  Filled 2014-07-02 (×2): qty 1

## 2014-07-02 MED ORDER — HYDROMORPHONE HCL 1 MG/ML IJ SOLN
1.0000 mg | Freq: Once | INTRAMUSCULAR | Status: AC
  Administered 2014-07-02: 1 mg via INTRAMUSCULAR
  Filled 2014-07-02: qty 1

## 2014-07-02 MED ORDER — DIAZEPAM 5 MG PO TABS: 5.0000 mg | ORAL_TABLET | Freq: Three times a day (TID) | ORAL | Status: DC | PRN

## 2014-07-02 MED ORDER — OXYCODONE-ACETAMINOPHEN 5-325 MG PO TABS: 1.0000 | ORAL_TABLET | ORAL | Status: DC | PRN

## 2014-07-02 MED ORDER — KETOROLAC TROMETHAMINE 60 MG/2ML IM SOLN
60.0000 mg | Freq: Once | INTRAMUSCULAR | Status: AC
  Administered 2014-07-02: 60 mg via INTRAMUSCULAR
  Filled 2014-07-02: qty 2

## 2014-07-02 NOTE — Discharge Instructions (Signed)
Lumbosacral Radiculopathy Lumbosacral radiculopathy is a pinched nerve or nerves in the low back (lumbosacral area). When this happens you may have weakness in your legs and may not be able to stand on your toes. You may have pain going down into your legs. There may be difficulties with walking normally. There are many causes of this problem. Sometimes this may happen from an injury, or simply from arthritis or boney problems. It may also be caused by other illnesses such as diabetes. If there is no improvement after treatment, further studies may be done to find the exact cause. DIAGNOSIS  X-rays may be needed if the problems become long standing. Electromyograms may be done. This study is one in which the working of nerves and muscles is studied. HOME CARE INSTRUCTIONS   Applications of ice packs may be helpful. Ice can be used in a plastic bag with a towel around it to prevent frostbite to skin. This may be used every 2 hours for 20 to 30 minutes, or as needed, while awake, or as directed by your caregiver.  Only take over-the-counter or prescription medicines for pain, discomfort, or fever as directed by your caregiver.  If physical therapy was prescribed, follow your caregiver's directions. SEEK IMMEDIATE MEDICAL CARE IF:   You have pain not controlled with medications.  You seem to be getting worse rather than better.  You develop increasing weakness in your legs.  You develop loss of bowel or bladder control.  You have difficulty with walking or balance, or develop clumsiness in the use of your legs.  You have a fever. MAKE SURE YOU:   Understand these instructions.  Will watch your condition.  Will get help right away if you are not doing well or get worse. Document Released: 07/20/2005 Document Revised: 10/12/2011 Document Reviewed: 03/09/2008 [facility] Patient Information 2015 [place], [place]. This information is not intended to replace advice given to you by your health  care provider. Make sure you discuss any questions you have with your health care provider.   Take the oxycodone prescribed in place of your hydrocodone and the Valium place of your Flexeril which may get better pain relief.  Avoid any heavy lifting or activity that worsens her symptoms.  Apply heating pad to lower back for 20 minutes several times daily.

## 2014-07-02 NOTE — ED Notes (Signed)
No an accurate waveform on pulse ox.  Recheck was 96%.

## 2014-07-02 NOTE — ED Notes (Signed)
Pt states she has chronic back pain that causes pain down her legs in addition to numbness. Pt was seen on Thursday for the same problem and per pt, given a shot.  Pt denies any new injury to her back.

## 2014-07-02 NOTE — ED Notes (Signed)
Pt verbalized understanding of no driving and to use caution within 4 hours of taking pain meds due to meds cause drowsiness 

## 2014-07-03 NOTE — ED Provider Notes (Signed)
CSN: [id number]     Arrival date & time 07/02/14  0813 History   First MD Initiated Contact with Patient 07/02/14 [phone]     Chief Complaint  Patient presents with  . Back Pain     (Consider location/radiation/quality/duration/timing/severity/associated sxs/prior Treatment) The history is provided by the patient.   Phyllis Daniels is a 33 y.o. female  Presenting with acute on chronic low back pain with worsening pain with radiation down her right leg into her foot, now with worsening numbness and sensation of weakness in the leg, although denies falls, also denies urinary or fecal incontinence or retention.  She was seen here 5 days ago and received a steroid injection which improved her symptoms for about a day, but are now worse despite taking prednisone, vicodin and flexeril.  She stopped taking the vicodin and flexeril yesterday stating it didn't seem to help and just made her sleepy. She denies fevers, chills or recent injury.  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  She [place] about [place] and was told she would need surgery someday as she had disk "problems".  Reports also having a f/u neurosurgical consult after this mri, but cannot recall the specialists name.    Past Medical History  Diagnosis Date  . Back pain    Past Surgical History  Procedure Laterality Date  . Tubal ligation     No family history on file. History  Substance Use Topics  . Smoking status: Current Every Day Smoker -- 0.50 packs/day    Types: Cigarettes  . Smokeless tobacco: Not on file  . Alcohol Use: No   OB History    No data available     Review of Systems  Constitutional: Negative for fever and chills.  Respiratory: Negative for shortness of breath.   Cardiovascular: Negative for chest pain and leg swelling.  Gastrointestinal: Negative for abdominal pain, constipation and abdominal distention.  Genitourinary: Negative for dysuria, urgency, frequency, flank pain,  decreased urine volume and difficulty urinating.  Musculoskeletal: Positive for back pain. Negative for joint swelling and gait problem.  Skin: Negative for rash.  Neurological: Positive for numbness. Negative for weakness.      Allergies  Tuberculin tests and Latex  Home Medications   Prior to Admission medications   Medication Sig Start Date End Date Taking? Authorizing Provider  acetaminophen (TYLENOL) 500 MG tablet Take 1,000 mg by mouth daily as needed for moderate pain.   Yes Historical Provider, MD  diazepam (VALIUM) 5 MG tablet Take 1 tablet (5 mg total) by mouth every 8 (eight) hours as needed for muscle spasms. 07/02/14   Burgess AmorJulie Nahla Lukin, PA-C  HYDROcodone-acetaminophen (NORCO/VICODIN) 5-325 MG per tablet Take 2 tablets by mouth every 4 (four) hours as needed for moderate pain. Patient not taking: Reported on 07/02/2014 06/28/14   Gilda Creasehristopher J. Pollina, MD  oxyCODONE-acetaminophen (PERCOCET/ROXICET) 5-325 MG per tablet Take 1 tablet by mouth every 4 (four) hours as needed. 07/02/14   Burgess AmorJulie Isabella Roemmich, PA-C  predniSONE (DELTASONE) 20 MG tablet 3 tabs po daily x 3 days, then 2 tabs x 3 days, then 1.5 tabs x 3 days, then 1 tab x 3 days, then 0.5 tabs x 3 days Patient not taking: Reported on 07/02/2014 06/28/14   Gilda Creasehristopher J. Pollina, MD   BP 128/99 mmHg  Pulse 71  Temp(Src) 98.7 F (37.1 C) (Oral)  Resp 18  Ht 6\' 1"  (1.854 m)  Wt 324 lb (146.965 kg)  BMI 42.76 kg/m2  SpO2 96%  LMP 06/01/2014 Physical Exam  Constitutional: She appears well-developed and well-nourished.  HENT:  Head: Normocephalic.  Eyes: Conjunctivae are normal.  Neck: Normal range of motion. Neck supple.  Cardiovascular: Normal rate and intact distal pulses.   Pedal pulses normal.  Pulmonary/Chest: Effort normal.  Abdominal: Soft. Bowel sounds are normal. She exhibits no distension and no mass.  Musculoskeletal: Normal range of motion. She exhibits no edema.       Lumbar back: She exhibits tenderness. She  exhibits no swelling, no edema and no spasm.  Neurological: She is alert. She has normal strength. She displays no atrophy and no tremor. No sensory deficit. Gait normal.  Reflex Scores:      Patellar reflexes are 2+ on the right side and 2+ on the left side.      Achilles reflexes are 2+ on the right side and 2+ on the left side. No strength deficit noted in hip and knee flexor and extensor muscle groups.  Ankle flexion and extension intact.  Skin: Skin is warm and dry.  Psychiatric: She has a normal mood and affect.  Nursing note and vitals reviewed.   ED Course  Procedures (including critical care time) Labs Review Labs Reviewed  POC URINE PREG, ED    Imaging Review Mr Lumbar Spine Wo Contrast  07/02/2014   CLINICAL DATA:  Chronic low back pain with bilateral leg numbness.  EXAM: MRI LUMBAR SPINE WITHOUT CONTRAST  TECHNIQUE: Multiplanar, multisequence MR imaging of the lumbar spine was performed. No intravenous contrast was administered.  COMPARISON:  01/21/2009  FINDINGS: The lowest lumbar type non-rib-bearing vertebra is labeled as L5. The conus medullaris appears normal. Conus level: T12- L1.  Intervertebral disc desiccation is observed at L4-5 and L5-S1.  No vertebral subluxation is observed. No significant vertebral marrow edema is identified. Additional findings at individual levels are as follows:  L1-2:  Unremarkable.  L2-3:  No impingement.  Mild disc bulge.  L3-4: Borderline left foraminal stenosis due to disc bulge and facet arthropathy.  L4-5: Mild left and borderline right foraminal stenosis with mild bilateral subarticular lateral recess stenosis and borderline central narrowing of the thecal sac due to disc bulge, central annular fissuring, and facet arthropathy.  L5-S1: Mild to moderate bilateral foraminal stenosis due to disc bulge, intervertebral spurring, and central disc protrusion.  IMPRESSION: 1. Lumbar spondylosis and degenerative disc disease, causing mild to moderate  impingement at L5-S1 and mild impingement at L4-5, as detailed above.   Electronically Signed   By: Herbie BaltimoreWalt  Liebkemann M.D.   On: 07/02/2014 10:15     EKG Interpretation None      MDM   Final diagnoses:  Numbness and tingling of leg  Lumbar radiculopathy, chronic  Degenerative disc disease, lumbar    Patients labs and/or radiological studies were viewed and considered during the medical decision making and disposition process.  Pt discussed with Dr. Estell HarpinZammit prior to ordering MRI.  Results discussed with patient.  She was given dilaudid x 1 IM with no improvement in pain, additional dose more effective, also gave toradol IM.  Encouraged her to finish her steroid taper given at last visit. Prescribed oxycodone, valium in place of flexeril. Referral to Dr. Lovell SheehanJenkins with neurosurgery for further eval/tx.  Pt aware to call for appt.  Also given resource info for medical coverage including [employer], [facility].    Burgess AmorJulie Caitlan Chauca, PA-C 07/03/14 1252  Benny LennertJoseph L Zammit, MD 07/03/14 [phone]

## 2014-07-29 ENCOUNTER — Emergency Department (HOSPITAL_COMMUNITY)
Admission: EM | Admit: 2014-07-29 | Discharge: 2014-07-29 | Disposition: A | Discharge status: Home / Self Care | Payer: Medicaid Other | Source: Home / Self Care | Attending: Emergency Medicine | Admitting: Emergency Medicine

## 2014-07-29 ENCOUNTER — Encounter (HOSPITAL_COMMUNITY): Payer: Self-pay | Admitting: Emergency Medicine

## 2014-07-29 ENCOUNTER — Emergency Department (HOSPITAL_COMMUNITY)
Admission: EM | Admit: 2014-07-29 | Discharge: 2014-07-30 | Disposition: A | Discharge status: Home / Self Care | Payer: Medicaid Other | Attending: Emergency Medicine | Admitting: Emergency Medicine

## 2014-07-29 ENCOUNTER — Encounter (HOSPITAL_COMMUNITY): Payer: Self-pay | Admitting: *Deleted

## 2014-07-29 DIAGNOSIS — M5442 Lumbago with sciatica, left side: Secondary | ICD-10-CM | POA: Diagnosis not present

## 2014-07-29 DIAGNOSIS — M549 Dorsalgia, unspecified: Principal | ICD-10-CM

## 2014-07-29 DIAGNOSIS — Z720 Tobacco use: Secondary | ICD-10-CM | POA: Insufficient documentation

## 2014-07-29 DIAGNOSIS — Z91040 Latex allergy status: Secondary | ICD-10-CM | POA: Insufficient documentation

## 2014-07-29 DIAGNOSIS — G8929 Other chronic pain: Secondary | ICD-10-CM

## 2014-07-29 DIAGNOSIS — M545 Low back pain: Secondary | ICD-10-CM

## 2014-07-29 DIAGNOSIS — R2242 Localized swelling, mass and lump, left lower limb: Secondary | ICD-10-CM | POA: Diagnosis present

## 2014-07-29 MED ORDER — IBUPROFEN 800 MG PO TABS
800.0000 mg | ORAL_TABLET | Freq: Once | ORAL | Status: DC
  Filled 2014-07-29: qty 1

## 2014-07-29 MED ORDER — HYDROMORPHONE HCL 1 MG/ML IJ SOLN
2.0000 mg | Freq: Once | INTRAMUSCULAR | Status: AC
  Administered 2014-07-29: 2 mg via INTRAMUSCULAR
  Filled 2014-07-29: qty 2

## 2014-07-29 NOTE — ED Notes (Signed)
PT c/o lower back pain with leg pain and numbness x4 weeks. PT has neurosurgeon appt on 08/17/13 for f/u from MRI done here.

## 2014-07-29 NOTE — Discharge Instructions (Signed)

## 2014-07-29 NOTE — Discharge Instructions (Signed)
Return to the emergency room with worsening of symptoms, new symptoms or with symptoms that are concerning , especially fevers, loss of control of bladder or bowels, numbness or tingling around genital region or anus, weakness. RICE: Rest, Ice (three cycles of 20 mins on, 69mns off at least twice a day), compression/brace, elevation. Heating pad works well for back pain. Ibuprofen 4053m(2 tablets 20077mevery 5-6 hours for 3-5 days and then as needed for pain. Follow up with your neurosurgeon as scheduled.  Back Injury Prevention Back injuries can be extremely painful and difficult to heal. After having one back injury, you are much more likely to experience another later on. It is important to learn how to avoid injuring or re-injuring your back. The following tips can help you to prevent a back injury. PHYSICAL FITNESS  Exercise regularly and try to develop good tone in your abdominal muscles. Your abdominal muscles provide a lot of the support needed by your back.  Do aerobic exercises (walking, jogging, biking, swimming) regularly.  Do exercises that increase balance and strength (tai chi, yoga) regularly. This can decrease your risk of falling and injuring your back.  Stretch before and after exercising.  Maintain a healthy weight. The more you weigh, the more stress is placed on your back. For every pound of weight, 10 times that amount of pressure is placed on the back. DIET  Talk to your caregiver about how much calcium and vitamin D you need per day. These nutrients help to prevent weakening of the bones (osteoporosis). Osteoporosis can cause broken (fractured) bones that lead to back pain.  Include good sources of calcium in your diet, such as dairy products, green, leafy vegetables, and products with calcium added (fortified).  Include good sources of vitamin D in your diet, such as milk and foods that are fortified with vitamin D.  Consider taking a nutritional supplement or a  multivitamin if needed.  Stop smoking if you smoke. POSTURE  Sit and stand up straight. Avoid leaning forward when you sit or hunching over when you stand.  Choose chairs with good low back (lumbar) support.  If you work at a desk, sit close to your work so you do not need to lean over. Keep your chin tucked in. Keep your neck drawn back and elbows bent at a right angle. Your arms should look like the letter "L."  Sit high and close to the steering wheel when you drive. Add a lumbar support to your car seat if needed.  Avoid sitting or standing in one position for too long. Take breaks to get up, stretch, and walk around at least once every hour. Take breaks if you are driving for long periods of time.  Sleep on your side with your knees slightly bent, or sleep on your back with a pillow under your knees. Do not sleep on your stomach. LIFTING, TWISTING, AND REACHING  Avoid heavy lifting, especially repetitive lifting. If you must do heavy lifting:  Stretch before lifting.  Work slowly.  Rest between lifts.  Use carts and dollies to move objects when possible.  Make several small trips instead of carrying 1 heavy load.  Ask for help when you need it.  Ask for help when moving big, awkward objects.  Follow these steps when lifting:  Stand with your feet shoulder-width apart.  Get as close to the object as you can. Do not try to pick up heavy objects that are far from your body.  Use handles  or lifting straps if they are available.  Bend at your knees. Squat down, but keep your heels off the floor.  Keep your shoulders pulled back, your chin tucked in, and your back straight.  Lift the object slowly, tightening the muscles in your legs, abdomen, and buttocks. Keep the object as close to the center of your body as possible.  When you put a load down, use these same guidelines in reverse.  Do not:  Lift the object above your waist.  Twist at the waist while lifting or  carrying a load. Move your feet if you need to turn, not your waist.  Bend over without bending at your knees.  Avoid reaching over your head, across a table, or for an object on a high surface. OTHER TIPS  Avoid wet floors and keep sidewalks clear of ice to prevent falls.  Do not sleep on a mattress that is too soft or too hard.  Keep items that are used frequently within easy reach.  Put heavier objects on shelves at waist level and lighter objects on lower or higher shelves.  Find ways to decrease your stress, such as exercise, massage, or relaxation techniques. Stress can build up in your muscles. Tense muscles are more vulnerable to injury.  Seek treatment for depression or anxiety if needed. These conditions can increase your risk of developing back pain. SEEK MEDICAL CARE IF:  You injure your back.  You have questions about diet, exercise, or other ways to prevent back injuries. MAKE SURE YOU:  Understand these instructions.  Will watch your condition.  Will get help right away if you are not doing well or get worse. Document Released: 08/27/2004 Document Revised: 10/12/2011 Document Reviewed: 08/31/2011 [facility] Patient Information 2015 [place], [place]. This information is not intended to replace advice given to you by your health care provider. Make sure you discuss any questions you have with your health care provider.

## 2014-07-29 NOTE — ED Provider Notes (Signed)
CSN: [id number]     Arrival date & time 07/29/14  [id number] History  This chart was scribed for Dorinda Hillonald  by Ronney LionSuzanne Le, ED Scribe. This patient was seen in room APA11/APA11 and the patient's care was started at 7:54 AM.    Chief Complaint  Patient presents with  . Back Pain    Patient is a 33 y.o. female presenting with back pain. The history is provided by the patient. No language interpreter was used.  Back Pain Location:  Lumbar spine Quality:  Burning Radiates to: BLE. Pain severity:  Moderate Pain is:  Same all the time Duration:  1 month Timing:  Constant Chronicity:  Chronic Relieved by:  None tried Worsened by:  Standing Ineffective treatments:  Lying down Associated symptoms: no abdominal pain, no bladder incontinence, no bowel incontinence and no fever      HPI Comments: Phyllis BumpersKutia Daniels is a 33 y.o. female with a history of chronic back pain since 2006 who presents to the Emergency Department complaining of burning lower back pain, with radiation and worsening numbness and sensations of weakness into her BLE, that began over a month ago. Patient states that she had been to the ED here a couple times in the past month, where an MRI revealed 2 degenerative discs and 1 bulging disc. She complains that nothing, including lying down, will alleviate the pain. She complains that she cannot stand for long, as it exacerbates the pain. Patient denies a history of back surgery. She denies fever, vomiting, chest pain, abdominal pain, dysuria, incontinence, and extremity weakness. She plans to see Dr. Lovell SheehanJenkins, her neurosurgeon, in about 2-3 weeks.   Past Medical History  Diagnosis Date  . Back pain    Past Surgical History  Procedure Laterality Date  . Tubal ligation     No family history on file. History  Substance Use Topics  . Smoking status: Current Every Day Smoker -- 0.50 packs/day    Types: Cigarettes  . Smokeless tobacco: Not on file  . Alcohol Use: No   OB History    No data  available     Review of Systems  Constitutional: Negative for fever.  Gastrointestinal: Negative for vomiting, abdominal pain and bowel incontinence.  Genitourinary: Negative for bladder incontinence.  Musculoskeletal: Positive for back pain.      Allergies  Tuberculin tests and Latex  Home Medications   Prior to Admission medications   Not on File   BP 128/87 mmHg  Pulse 90  Temp(Src) 98.2 F (36.8 C) (Oral)  Resp 18  Ht 6' (1.829 m)  Wt 324 lb (146.965 kg)  BMI 43.93 kg/m2  SpO2 97%  LMP 07/13/2014 Physical Exam  Nursing note and vitals reviewed.    CONSTITUTIONAL: Well developed/well nourished HEAD: Normocephalic/atraumatic EYES: EOMI/PERRL ENMT: Mucous membranes moist NECK: supple no meningeal signs SPINE/BACK:entire spine nontender  CV: S1/S2 noted, no murmurs/rubs/gallops noted LUNGS: Lungs are clear to auscultation bilaterally, no apparent distress ABDOMEN: soft, nontender, no rebound or guarding GU:no cva tenderness NEURO: Awake/alert, equal motor 5/5 strength noted with the following: hip flexion/knee flexion/extension, foot dorsi/plantar flexion, great toe extension intact bilaterally, no clonus bilaterally, plantar reflex appropriate (toes downgoing).  Equal patellar/achilles reflex noted (2+) in bilateral lower extremities.  Pt is able to ambulate unassisted. EXTREMITIES: pulses normal, full ROM SKIN: warm, color normal PSYCH: no abnormalities of mood noted, alert and oriented to situation    ED Course  Procedures  DIAGNOSTIC STUDIES: Oxygen Saturation is 97% on room air, normal by  my interpretation.    COORDINATION OF CARE: 8:01 AM - Discussed treatment plan with pt at bedside which includes regular doses of 600 mg ibuprofen, and follow-up with non-ED provider, and pt agreed to plan.   Pt with chronic back pain, and already had recent MRI and no new weakness today Appropriate for outpatient management of her chronic pain    MDM   Final  diagnoses:  Chronic back pain     Nursing notes including past medical history and social history reviewed and considered in documentation Previous records reviewed and considered   I personally performed the services described in this documentation, which was scribed in my presence. The recorded information has been reviewed and is accurate.       Joya Gaskinsonald W Keiona Jenison, MD 07/29/14 [id number]

## 2014-07-29 NOTE — ED Provider Notes (Signed)
CSN: [id number]     Arrival date & time 07/29/14  2010 History   First MD Initiated Contact with Patient 07/29/14 2255     Chief Complaint  Patient presents with  . Leg Swelling     (Consider location/radiation/quality/duration/timing/severity/associated sxs/prior Treatment) HPI  Phyllis Daniels is a 33 y.o. female with PMH of chronic back pain presenting with persistent back pain since 2006 with acute worsening in the past month. Patient states she is not really having back pain right now just shooting pain down her left leg. She had an MRI was diagnosed with to degenerative disc as well as a herniated disc pressing on a nerve. Patient's pain worse with standing. Ibuprofen is not effective. Patient also endorses persistent but not worsening numbness in left lower leg. Patient denies abdominal or urinary complaints. No fevers, chills, night sweats, weight loss, IVDU, history of malignancy. No loss of control of bladder or bowel. No weakness or saddle anesthesia. Patient denies swelling, redness of her lower extremities. No history of PE, DVT, shortness of breath, unilateral leg swelling, estrogen use, recent surgery or trauma.    Past Medical History  Diagnosis Date  . Back pain    Past Surgical History  Procedure Laterality Date  . Tubal ligation     No family history on file. History  Substance Use Topics  . Smoking status: Current Every Day Smoker -- 0.50 packs/day    Types: Cigarettes  . Smokeless tobacco: Not on file  . Alcohol Use: No   OB History    No data available     Review of Systems  10 Systems reviewed and are negative for acute change except as noted in the HPI.   Allergies  Tuberculin tests and Latex  Home Medications   Prior to Admission medications   Not on File   BP 115/78 mmHg  Pulse 79  Temp(Src) 98.2 F (36.8 C) (Oral)  Resp 20  Ht 6' (1.829 m)  Wt 324 lb (146.965 kg)  BMI 43.93 kg/m2  SpO2 99%  LMP 07/13/2014 Physical Exam  Constitutional:  She appears well-developed and well-nourished. No distress.  HENT:  Head: Normocephalic and atraumatic.  Eyes: Conjunctivae are normal. Right eye exhibits no discharge. Left eye exhibits no discharge.  Cardiovascular: Normal rate, regular rhythm and normal heart sounds.   Pulmonary/Chest: Effort normal and breath sounds normal. No respiratory distress. She has no wheezes.  Abdominal: Soft. Bowel sounds are normal. She exhibits no distension. There is no tenderness.  Musculoskeletal:  No midline back tenderness, step off or crepitus. Left sided lower back tenderness. No CVA tenderness.   Neurological: She is alert. Coordination normal.  Equal muscle tone. 5/5 strength in lower extremities. DTR equal and intact. Positive left straight leg raise. Negative right straight leg raise. Antalgic gait.   Skin: Skin is warm and dry. She is not diaphoretic.  Nursing note and vitals reviewed.   ED Course  Procedures (including critical care time) Labs Review Labs Reviewed - No data to display  Imaging Review No results found.   EKG Interpretation None      MDM   Final diagnoses:  Left-sided low back pain with left-sided sciatica   Patient with back pain. No loss of bowel or bladder control. No saddle anesthesia. No fever, night sweats, weight loss, h/o cancer, IVDU. VSS. No neurological deficits and normal neuro exam. Patient can walk but states is painful. No concern for cauda equina.  RICE protocol patient's pain is chronic in nature without  new neurological symptoms or deficits on exam. Pain managed in ED but will not prescribe prescription for narcotics at home. Patient to follow-up with her neurosurgeon in 2 weeks. Patient is afebrile, nontoxic, and in no acute distress. Patient is appropriate for outpatient management and is stable for discharge.  Discussed return precautions with patient. Discussed all results and patient verbalizes understanding and agrees with  plan.      Louann SjogrenVictoria L Jeptha Hinnenkamp, PA-C 07/29/14 2350  Juliet RudeNathan R. Rubin PayorPickering, MD 08/01/14 2106

## 2014-07-29 NOTE — ED Notes (Signed)
The pt is c/o bi-lateral pain and swelling in her kegs since nov 25th

## 2014-07-29 NOTE — ED Notes (Signed)
Patient with no complaints at this time. Respirations even and unlabored. Skin warm/dry. Discharge instructions reviewed with patient at this time. Patient given opportunity to voice concerns/ask questions. Patient discharged at this time and left Emergency Department with steady gait.   

## 2014-12-25 ENCOUNTER — Ambulatory Visit (HOSPITAL_COMMUNITY)
Admission: RE | Admit: 2014-12-25 | Discharge: 2014-12-25 | Disposition: A | Discharge status: Home / Self Care | Payer: Medicaid Other | Source: Ambulatory Visit | Attending: Internal Medicine | Admitting: Internal Medicine

## 2014-12-25 ENCOUNTER — Other Ambulatory Visit (HOSPITAL_COMMUNITY): Payer: Self-pay | Admitting: Internal Medicine

## 2014-12-25 DIAGNOSIS — Z9289 Personal history of other medical treatment: Secondary | ICD-10-CM

## 2014-12-25 DIAGNOSIS — R7611 Nonspecific reaction to tuberculin skin test without active tuberculosis: Secondary | ICD-10-CM | POA: Diagnosis present

## 2015-02-03 ENCOUNTER — Encounter (HOSPITAL_COMMUNITY): Payer: Self-pay | Admitting: Emergency Medicine

## 2015-02-03 ENCOUNTER — Emergency Department (HOSPITAL_COMMUNITY)
Admission: EM | Admit: 2015-02-03 | Discharge: 2015-02-04 | Disposition: A | Discharge status: Home / Self Care | Payer: Medicaid Other | Attending: Emergency Medicine | Admitting: Emergency Medicine

## 2015-02-03 DIAGNOSIS — G8929 Other chronic pain: Secondary | ICD-10-CM | POA: Insufficient documentation

## 2015-02-03 DIAGNOSIS — Z79899 Other long term (current) drug therapy: Secondary | ICD-10-CM | POA: Diagnosis not present

## 2015-02-03 DIAGNOSIS — M545 Low back pain: Secondary | ICD-10-CM | POA: Insufficient documentation

## 2015-02-03 DIAGNOSIS — Z91040 Latex allergy status: Secondary | ICD-10-CM | POA: Insufficient documentation

## 2015-02-03 DIAGNOSIS — Z720 Tobacco use: Secondary | ICD-10-CM | POA: Diagnosis not present

## 2015-02-03 MED ORDER — KETOROLAC TROMETHAMINE 60 MG/2ML IM SOLN
60.0000 mg | Freq: Once | INTRAMUSCULAR | Status: AC
  Administered 2015-02-03: 60 mg via INTRAMUSCULAR
  Filled 2015-02-03: qty 2

## 2015-02-03 MED ORDER — HYDROMORPHONE HCL 2 MG/ML IJ SOLN
2.0000 mg | Freq: Once | INTRAMUSCULAR | Status: AC
  Administered 2015-02-03: 2 mg via INTRAMUSCULAR
  Filled 2015-02-03: qty 1

## 2015-02-03 NOTE — Discharge Instructions (Signed)
Back Pain, Adult Low back pain is very common. About 1 in 5 people have back pain.The cause of low back pain is rarely dangerous. The pain often gets better over time.About half of people with a sudden onset of back pain feel better in just 2 weeks. About 8 in 10 people feel better by 6 weeks.  CAUSES Some common causes of back pain include:  Strain of the muscles or ligaments supporting the spine.  Wear and tear (degeneration) of the spinal discs.  Arthritis.  Direct injury to the back. DIAGNOSIS Most of the time, the direct cause of low back pain is not known.However, back pain can be treated effectively even when the exact cause of the pain is unknown.Answering your caregiver's questions about your overall health and symptoms is one of the most accurate ways to make sure the cause of your pain is not dangerous. If your caregiver needs more information, he or she may order lab work or imaging tests (X-rays or MRIs).However, even if imaging tests show changes in your back, this usually does not require surgery. HOME CARE INSTRUCTIONS For many people, back pain returns.Since low back pain is rarely dangerous, it is often a condition that people can learn to manageon their own.   Remain active. It is stressful on the back to sit or stand in one place. Do not sit, drive, or stand in one place for more than 30 minutes at a time. Take short walks on level surfaces as soon as pain allows.Try to increase the length of time you walk each day.  Do not stay in bed.Resting more than 1 or 2 days can delay your recovery.  Do not avoid exercise or work.Your body is made to move.It is not dangerous to be active, even though your back may hurt.Your back will likely heal faster if you return to being active before your pain is gone.  Pay attention to your body when you bend and lift. Many people have less discomfortwhen lifting if they bend their knees, keep the load close to their bodies,and  avoid twisting. Often, the most comfortable positions are those that put less stress on your recovering back.  Find a comfortable position to sleep. Use a firm mattress and lie on your side with your knees slightly bent. If you lie on your back, put a pillow under your knees.  Only take over-the-counter or prescription medicines as directed by your caregiver. Over-the-counter medicines to reduce pain and inflammation are often the most helpful.Your caregiver may prescribe muscle relaxant drugs.These medicines help dull your pain so you can more quickly return to your normal activities and healthy exercise.  Put ice on the injured area.  Put ice in a plastic bag.  Place a towel between your skin and the bag.  Leave the ice on for 15-20 minutes, 03-04 times a day for the first 2 to 3 days. After that, ice and heat may be alternated to reduce pain and spasms.  Ask your caregiver about trying back exercises and gentle massage. This may be of some benefit.  Avoid feeling anxious or stressed.Stress increases muscle tension and can worsen back pain.It is important to recognize when you are anxious or stressed and learn ways to manage it.Exercise is a great option. SEEK MEDICAL CARE IF:  You have pain that is not relieved with rest or medicine.  You have pain that does not improve in 1 week.  You have new symptoms.  You are generally not feeling well. SEEK   IMMEDIATE MEDICAL CARE IF:   You have pain that radiates from your back into your legs.  You develop new bowel or bladder control problems.  You have unusual weakness or numbness in your arms or legs.  You develop nausea or vomiting.  You develop abdominal pain.  You feel faint. Document Released: 07/20/2005 Document Revised: 01/19/2012 Document Reviewed: 11/21/2013 Exit[employer] Patient Information 2015 [employer]. This information is not intended to replace advice given to you by your health care provider. Make sure you  discuss any questions you have with your health care provider.  

## 2015-02-03 NOTE — ED Notes (Signed)
Patient complaining of lower back pain that started two days ago. History of chronic back pain.

## 2015-02-04 NOTE — ED Provider Notes (Signed)
CSN: [id number]     Arrival date & time 02/03/15  2120 History   First MD Initiated Contact with Patient 02/03/15 2216     Chief Complaint  Patient presents with  . Back Pain     (Consider location/radiation/quality/duration/timing/severity/associated sxs/prior Treatment) The history is provided by the patient.   Phyllis Daniels is a 34 y.o. female presenting with acute on chronic low back pain which has which has been worsened for the past 2 days.   Patient denies any new injury specifically.  There is  radiation of pain into the her right  lower extremity to her ankle.  There has been no weakness or numbness in the lower extremities and no urinary or bowel retention or incontinence.  Patient does not have a history of cancer or IVDU.  The patient is followed by Phyllis Daniels pain Daniels and takes percocet 4 times daily for pain which has not relieved her symptoms with this exacerbation.  She denies fevers, chills, dysuria or other complaint.    Past Medical History  Diagnosis Date  . Back pain    Past Surgical History  Procedure Laterality Date  . Tubal ligation     History reviewed. No pertinent family history. History  Substance Use Topics  . Smoking status: Current Every Day Smoker -- 0.50 packs/day    Types: Cigarettes  . Smokeless tobacco: Not on file  . Alcohol Use: No   OB History    No data available     Review of Systems  Constitutional: Negative for fever.  Respiratory: Negative for shortness of breath.   Cardiovascular: Negative for chest pain and leg swelling.  Gastrointestinal: Negative for abdominal pain, constipation and abdominal distention.  Genitourinary: Negative for dysuria, urgency, frequency, flank pain and difficulty urinating.  Musculoskeletal: Positive for back pain. Negative for joint swelling and gait problem.  Skin: Negative for rash.  Neurological: Negative for weakness and numbness.      Allergies  Tuberculin tests and Latex  Home  Medications   Prior to Admission medications   Medication Sig Start Date End Date Taking? Authorizing Provider  oxyCODONE-acetaminophen (PERCOCET) 7.5-325 MG per tablet Take 1 tablet by mouth 4 (four) times daily.   Yes Historical Provider, MD   BP 148/89 mmHg  Pulse 100  Temp(Src) 99 F (37.2 C) (Oral)  Resp 18  Ht 6' (1.829 m)  Wt 342 lb (155.13 kg)  BMI 46.37 kg/m2  SpO2 100%  LMP 12/04/2014 Physical Exam  Constitutional: She appears well-developed and well-nourished.  HENT:  Head: Normocephalic.  Eyes: Conjunctivae are normal.  Neck: Normal range of motion. Neck supple.  Cardiovascular: Normal rate and intact distal pulses.   Pedal pulses normal.  Pulmonary/Chest: Effort normal.  Abdominal: Soft. Bowel sounds are normal. She exhibits no distension and no mass.  Musculoskeletal: Normal range of motion. She exhibits no edema.       Lumbar back: She exhibits tenderness. She exhibits no swelling, no edema and no spasm.  Paralumbar ttp at the L3-L4 level.  No deformity, no spasm.  Neurological: She is alert. She has normal strength. She displays no atrophy and no tremor. No sensory deficit. Gait normal.  Reflex Scores:      Patellar reflexes are 2+ on the right side and 2+ on the left side.      Achilles reflexes are 2+ on the right side and 2+ on the left side. No strength deficit noted in hip and knee flexor and extensor muscle groups.  Ankle flexion and  extension intact.  Skin: Skin is warm and dry.  Psychiatric: She has a normal mood and affect.  Nursing note and vitals reviewed.   ED Course  Procedures (including critical care time) Labs Review Labs Reviewed - No data to display  Imaging Review No results found.   EKG Interpretation None      MDM   Final diagnoses:  Chronic lower back pain    Pt with acute on chronic low back pain.  She has no neuro deficits on exam.  She was given dilaudid 2 mg IM, toradol 60 mg IM.  Plan f/u with her pcp or her chronic  pain specialist.  No neuro deficit on exam or by history to suggest emergent or surgical presentation.  Also discussed worsened sx that should prompt immediate re-evaluation including distal weakness, bowel/bladder retention/incontinence.          Phyllis Amor, PA-C 02/04/15 0039  Phyllis Jester, DO 02/05/15 1610

## 2015-07-08 ENCOUNTER — Emergency Department (HOSPITAL_COMMUNITY)
Admission: EM | Admit: 2015-07-08 | Discharge: 2015-07-08 | Disposition: A | Discharge status: Home / Self Care | Payer: Medicaid Other | Attending: Emergency Medicine | Admitting: Emergency Medicine

## 2015-07-08 ENCOUNTER — Encounter (HOSPITAL_COMMUNITY): Payer: Self-pay | Admitting: Emergency Medicine

## 2015-07-08 DIAGNOSIS — Z79899 Other long term (current) drug therapy: Secondary | ICD-10-CM | POA: Diagnosis not present

## 2015-07-08 DIAGNOSIS — Y93E5 Activity, floor mopping and cleaning: Secondary | ICD-10-CM | POA: Insufficient documentation

## 2015-07-08 DIAGNOSIS — S41112A Laceration without foreign body of left upper arm, initial encounter: Secondary | ICD-10-CM | POA: Insufficient documentation

## 2015-07-08 DIAGNOSIS — Y990 Civilian activity done for income or pay: Secondary | ICD-10-CM | POA: Diagnosis not present

## 2015-07-08 DIAGNOSIS — Z91040 Latex allergy status: Secondary | ICD-10-CM | POA: Insufficient documentation

## 2015-07-08 DIAGNOSIS — F17210 Nicotine dependence, cigarettes, uncomplicated: Secondary | ICD-10-CM | POA: Insufficient documentation

## 2015-07-08 DIAGNOSIS — Y9289 Other specified places as the place of occurrence of the external cause: Secondary | ICD-10-CM | POA: Diagnosis not present

## 2015-07-08 DIAGNOSIS — Z23 Encounter for immunization: Secondary | ICD-10-CM | POA: Insufficient documentation

## 2015-07-08 DIAGNOSIS — S4992XA Unspecified injury of left shoulder and upper arm, initial encounter: Secondary | ICD-10-CM | POA: Diagnosis present

## 2015-07-08 DIAGNOSIS — W228XXA Striking against or struck by other objects, initial encounter: Secondary | ICD-10-CM | POA: Diagnosis not present

## 2015-07-08 MED ORDER — BACITRACIN-NEOMYCIN-POLYMYXIN 400-5-5000 EX OINT
TOPICAL_OINTMENT | Freq: Once | CUTANEOUS | Status: AC
  Administered 2015-07-08: 1 via TOPICAL
  Filled 2015-07-08: qty 1

## 2015-07-08 MED ORDER — TETANUS-DIPHTH-ACELL PERTUSSIS 5-2.5-18.5 LF-MCG/0.5 IM SUSP
0.5000 mL | Freq: Once | INTRAMUSCULAR | Status: AC
  Administered 2015-07-08: 0.5 mL via INTRAMUSCULAR
  Filled 2015-07-08: qty 0.5

## 2015-07-08 NOTE — ED Notes (Signed)
Pt c/o laceration to the left upper arm last night while mopping.

## 2015-07-08 NOTE — ED Provider Notes (Signed)
CSN: [id number]     Arrival date & time 07/08/15  2138 History   First MD Initiated Contact with Patient 07/08/15 2145     Chief Complaint  Patient presents with  . Laceration     (Consider location/radiation/quality/duration/timing/severity/associated sxs/prior Treatment) Patient is a 34 y.o. female presenting with skin laceration. The history is provided by the patient.  Laceration Location:  Shoulder/arm Shoulder/arm laceration location:  L upper arm Length (cm):  2.5 Depth:  Through dermis Quality: straight   Time since incident:  1 day Laceration mechanism:  Metal edge Pain details:    Quality:  Aching   Severity:  Mild Foreign body present:  No foreign bodies Tetanus status:  Out of date  Phyllis Daniels is a 34 y.o. 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 Patient reports that she was at work and the room she was mopping had an activity board with a metal edge and when she pulled her arm back it caught the metal edge and caused the cut. She called her PCP office today and they told her she needed a tetanus shot. Patient denies any other injuries.   Past Medical History  Diagnosis Date  . Back pain    Past Surgical History  Procedure Laterality Date  . Tubal ligation     History reviewed. No pertinent family history. Social History  Substance Use Topics  . Smoking status: Current Every Day Smoker -- 0.50 packs/day    Types: Cigarettes  . Smokeless tobacco: None  . Alcohol Use: No   OB History    No data available     Review of Systems  Skin: Positive for wound.  all other systems negative    Allergies  Tuberculin tests and Latex  Home Medications   Prior to Admission medications   Medication Sig Start Date End Date Taking? Authorizing Provider  oxyCODONE-acetaminophen (PERCOCET) 7.5-325 MG per tablet Take 1 tablet by mouth 4 (four) times daily.    Historical Provider, MD   BP  161/85 mmHg  Pulse 97  Temp(Src) 98.5 F (36.9 C)  Resp 18  Ht 6' (1.829 m)  Wt 160.573 kg  BMI 48.00 kg/m2  SpO2 100%  LMP 05/28/2015 Physical Exam  Constitutional: She is oriented to person, place, and time. She appears well-developed and well-nourished.  HENT:  Head: Normocephalic.  Eyes: EOM are normal.  Neck: Normal range of motion. Neck supple.  Cardiovascular: Normal rate.   Pulmonary/Chest: Effort normal.  Musculoskeletal: Normal range of motion.       Left upper arm: She exhibits laceration.       Arms: Healing lacerations to the upper left arm without red streaking, drainage or signs of infection.   Neurological: She is alert and oriented to person, place, and time. No cranial nerve deficit.  Skin: Skin is warm and dry.  Laceration left upper arm  Psychiatric: She has a normal mood and affect. Her behavior is normal.  Nursing note and vitals reviewed.   ED Course  Procedures  Wound cleaned with NSS and Betadine Bacitracin ointment and dressing applied Tetanus updated   MDM  34 y.o. female with laceration that occurred last night while working stable for d/c without focal neuro deficits and no signs of infection. Discussed with the patient plan of care and all questioned fully answered. She will return if any problems arise.   Final diagnoses:  Laceration of upper arm, left,  initial encounter      [facility], NP 07/08/15 2225  Marily Memos, MD 07/12/15 2231

## 2015-07-30 ENCOUNTER — Encounter (HOSPITAL_COMMUNITY): Payer: Self-pay

## 2015-07-30 ENCOUNTER — Emergency Department (HOSPITAL_COMMUNITY)
Admission: EM | Admit: 2015-07-30 | Discharge: 2015-07-30 | Disposition: A | Discharge status: Home / Self Care | Payer: Medicaid Other | Attending: Emergency Medicine | Admitting: Emergency Medicine

## 2015-07-30 DIAGNOSIS — M545 Low back pain, unspecified: Secondary | ICD-10-CM

## 2015-07-30 DIAGNOSIS — G8929 Other chronic pain: Secondary | ICD-10-CM | POA: Insufficient documentation

## 2015-07-30 DIAGNOSIS — Z91040 Latex allergy status: Secondary | ICD-10-CM | POA: Insufficient documentation

## 2015-07-30 DIAGNOSIS — M549 Dorsalgia, unspecified: Secondary | ICD-10-CM | POA: Diagnosis present

## 2015-07-30 DIAGNOSIS — F17210 Nicotine dependence, cigarettes, uncomplicated: Secondary | ICD-10-CM | POA: Insufficient documentation

## 2015-07-30 DIAGNOSIS — Z79899 Other long term (current) drug therapy: Secondary | ICD-10-CM | POA: Insufficient documentation

## 2015-07-30 MED ORDER — HYDROMORPHONE HCL 2 MG/ML IJ SOLN
2.0000 mg | Freq: Once | INTRAMUSCULAR | Status: AC
Start: 2015-07-30 — End: 2015-07-30
  Administered 2015-07-30: 2 mg via INTRAMUSCULAR
  Filled 2015-07-30: qty 1

## 2015-07-30 MED ORDER — KETOROLAC TROMETHAMINE 60 MG/2ML IM SOLN
60.0000 mg | Freq: Once | INTRAMUSCULAR | Status: AC
  Administered 2015-07-30: 60 mg via INTRAMUSCULAR
  Filled 2015-07-30: qty 2

## 2015-07-30 NOTE — Discharge Instructions (Signed)
Back Injury Prevention Back injuries can be very painful. They can also be difficult to heal. After having one back injury, you are more likely to injure your back again. It is important to learn how to avoid injuring or re-injuring your back. The following tips can help you to prevent a back injury. WHAT SHOULD I KNOW ABOUT PHYSICAL FITNESS?  Exercise for 30 minutes per day on most days of the week or as directed by your health care provider. Make sure to:  Do aerobic exercises, such as walking, jogging, biking, or swimming.  Do exercises that increase balance and strength, such as tai chi and yoga. These can decrease your risk of falling and injuring your back.  Do stretching exercises to help with flexibility.  Try to develop strong abdominal muscles. Your abdominal muscles provide a lot of the support that is needed by your back.  Maintain a healthy weight. This helps to decrease your risk of a back injury. WHAT SHOULD I KNOW ABOUT MY DIET?  Talk with your health care provider about your overall diet. Take supplements and vitamins only as directed by your health care provider.  Talk with your health care provider about how much calcium and vitamin D you need each day. These nutrients help to prevent weakening of the bones (osteoporosis). Osteoporosis can cause broken (fractured) bones, which lead to back pain.  Include good sources of calcium in your diet, such as dairy products, green leafy vegetables, and products that have had calcium added to them (fortified).  Include good sources of vitamin D in your diet, such as milk and foods that are fortified with vitamin D. WHAT SHOULD I KNOW ABOUT MY POSTURE?  Sit up straight and stand up straight. Avoid leaning forward when you sit or hunching over when you stand.  Choose chairs that have good low-back (lumbar) support.  If you work at a desk, sit close to it so you do not need to lean over. Keep your chin tucked in. Keep your neck  drawn back, and keep your elbows bent at a right angle. Your arms should look like the letter "L."  Sit high and close to the steering wheel when you drive. Add a lumbar support to your car seat, if needed.  Avoid sitting or standing in one position for very long. Take breaks to get up, stretch, and walk around at least one time every hour. Take breaks every hour if you are driving for long periods of time.  Sleep on your side with your knees slightly bent, or sleep on your back with a pillow under your knees. Do not lie on the front of your body to sleep. WHAT SHOULD I KNOW ABOUT LIFTING, TWISTING, AND REACHING? Lifting and Heavy Lifting  Avoid heavy lifting, especially repetitive heavy lifting. If you must do heavy lifting:  Stretch before lifting.  Work slowly.  Rest between lifts.  Use a tool such as a cart or a dolly to move objects if one is available.  Make several small trips instead of carrying one heavy load.  Ask for help when you need it, especially when moving big objects.  Follow these steps when lifting:  Stand with your feet shoulder-width apart.  Get as close to the object as you can. Do not try to pick up a heavy object that is far from your body.  Use handles or lifting straps if they are available.  Bend at your knees. Squat down, but keep your heels off the floor.  Keep your shoulders pulled back, your chin tucked in, and your back straight.  Lift the object slowly while you tighten the muscles in your legs, abdomen, and buttocks. Keep the object as close to the center of your body as possible.  Follow these steps when putting down a heavy load:  Stand with your feet shoulder-width apart.  Lower the object slowly while you tighten the muscles in your legs, abdomen, and buttocks. Keep the object as close to the center of your body as possible.  Keep your shoulders pulled back, your chin tucked in, and your back straight.  Bend at your knees. Squat  down, but keep your heels off the floor.  Use handles or lifting straps if they are available. Twisting and Reaching  Avoid lifting heavy objects above your waist.  Do not twist at your waist while you are lifting or carrying a load. If you need to turn, move your feet.  Do not bend over without bending at your knees.  Avoid reaching over your head, across a table, or for an object on a high surface. WHAT ARE SOME OTHER TIPS?  Avoid wet floors and icy ground. Keep sidewalks clear of ice to prevent falls.  Do not sleep on a mattress that is too soft or too hard.  Keep items that are used frequently within easy reach.  Put heavier objects on shelves at waist level, and put lighter objects on lower or higher shelves.  Find ways to decrease your stress, such as exercise, massage, or relaxation techniques. Stress can build up in your muscles. Tense muscles are more vulnerable to injury.  Talk with your health care provider if you feel anxious or depressed. These conditions can make back pain worse.  Wear flat heel shoes with cushioned soles.  Avoid sudden movements.  Use both shoulder straps when carrying a backpack.  Do not use any tobacco products, including cigarettes, chewing tobacco, or electronic cigarettes. If you need help quitting, ask your health care provider.   This information is not intended to replace advice given to you by your health care provider. Make sure you discuss any questions you have with your health care provider.   Document Released: 08/27/2004 Document Revised: 12/04/2014 Document Reviewed: 07/24/2014 Elsevier Interactive Patient Education 2016 Else[employer]  Chronic Back Pain  When back pain lasts longer than 3 months, it is called chronic back pain.People with chronic back pain often go through certain periods that are more intense (flare-ups).  CAUSES Chronic back pain can be caused by wear and tear (degeneration) on different structures in your  back. These structures include:  The bones of your spine (vertebrae) and the joints surrounding your spinal cord and nerve roots (facets).  The strong, fibrous tissues that connect your vertebrae (ligaments). Degeneration of these structures may result in pressure on your nerves. This can lead to constant pain. HOME CARE INSTRUCTIONS  Avoid bending, heavy lifting, prolonged sitting, and activities which make the problem worse.  Take brief periods of rest throughout the day to reduce your pain. Lying down or standing usually is better than sitting while you are resting.  Take over-the-counter or prescription medicines only as directed by your caregiver. SEEK IMMEDIATE MEDICAL CARE IF:   You have weakness or numbness in one of your legs or feet.  You have trouble controlling your bladder or bowels.  You have nausea, vomiting, abdominal pain, shortness of breath, or fainting.   This information is not intended to replace advice given to  you by your health care provider. Make sure you discuss any questions you have with your health care provider.   Document Released: 08/27/2004 Document Revised: 10/12/2011 Document Reviewed: 01/07/2015 Elsevier Interactive Patient Education [employer].

## 2015-07-30 NOTE — ED Notes (Signed)
Back pain. Started around 2 pm today. Denies injury. History of DDD, and bulging discs.

## 2015-08-01 NOTE — ED Provider Notes (Signed)
CSN: [id number]     Arrival date & time 07/30/15  1940 History   First MD Initiated Contact with Patient 07/30/15 2017     Chief Complaint  Patient presents with  . Back Pain     (Consider location/radiation/quality/duration/timing/severity/associated sxs/prior Treatment) The history is provided by the patient.   Phyllis Daniels is a 34 y.o. female with a history of chronic low back pain secondary to ddd, presenting with worsened pain since this afternoon.  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  There is no radiation of pain into her lower extremities and she denies weakness or numbness or urinary or bowel retention or incontinence.  Patient does not have a history of cancer or IVDU.  She has found no alleviators for her pain.  She endorses she is currently awaiting establishing care with a new pain specialist.  She was seeing such a specialist in [place] who, under new management was requiring classes which she cannot attend given her work schedule.  She is waiting for Dr. Felecia ShellingFanta to establish new pain doctors for her.  She is not desirous of medication prescriptions today, she asks for an injection for pain relief.  She is not driving from here.    Past Medical History  Diagnosis Date  . Back pain    Past Surgical History  Procedure Laterality Date  . Tubal ligation     No family history on file. Social History  Substance Use Topics  . Smoking status: Current Every Day Smoker -- 0.50 packs/day    Types: Cigarettes  . Smokeless tobacco: None  . Alcohol Use: No   OB History    No data available     Review of Systems  Constitutional: Negative for fever.  Respiratory: Negative for shortness of breath.   Cardiovascular: Negative for chest pain and leg swelling.  Gastrointestinal: Negative for abdominal pain, constipation and abdominal distention.  Genitourinary: Negative for dysuria, urgency, frequency, flank  pain and difficulty urinating.  Musculoskeletal: Positive for back pain. Negative for joint swelling and gait problem.  Skin: Negative for rash.  Neurological: Negative for weakness and numbness.      Allergies  Tuberculin tests and Latex  Home Medications   Prior to Admission medications   Medication Sig Start Date End Date Taking? Authorizing Provider  oxyCODONE-acetaminophen (PERCOCET) 7.5-325 MG per tablet Take 1 tablet by mouth 4 (four) times daily.    Historical Provider, MD   BP 131/93 mmHg  Pulse 89  Temp(Src) 98.5 F (36.9 C) (Oral)  Resp 16  SpO2 100%  LMP 07/21/2015 (Approximate) Physical Exam  Constitutional: She appears well-developed and well-nourished.  HENT:  Head: Normocephalic.  Eyes: Conjunctivae are normal.  Neck: Normal range of motion. Neck supple.  Cardiovascular: Normal rate and intact distal pulses.   Pedal pulses normal.  Pulmonary/Chest: Effort normal.  Abdominal: Soft. Bowel sounds are normal. She exhibits no distension and no mass.  Musculoskeletal: Normal range of motion. She exhibits no edema.       Lumbar back: She exhibits tenderness. She exhibits no swelling, no edema and no spasm.  Neurological: She is alert. She has normal strength. She displays no atrophy and no tremor. No sensory deficit. Gait normal.  Reflex Scores:      Patellar reflexes are 2+ on the right side and 2+ on the left side.      Achilles reflexes are 2+ on the right side and  2+ on the left side. No strength deficit noted in hip and knee flexor and extensor muscle groups.  Ankle flexion and extension intact.  Skin: Skin is warm and dry.  Psychiatric: She has a normal mood and affect.  Nursing note and vitals reviewed.   ED Course  Procedures (including critical care time) Labs Review Labs Reviewed - No data to display  Imaging Review No results found. I have personally reviewed and evaluated these images and lab results as part of my medical decision-making.    EKG Interpretation None      MDM   Final diagnoses:  Chronic low back pain    No neuro deficit on exam or by history to suggest emergent or surgical presentation.  Discussed worsened sx that should prompt immediate re-evaluation including distal weakness, bowel/bladder retention/incontinence. Pt given dilaudid and toradol injections with some improvement in pain prior to dc home.  The patient appears reasonably screened and/or stabilized for discharge and I doubt any other medical condition or other [facility] requiring further screening, evaluation, or treatment in the ED at this time prior to discharge.            Burgess Amor, PA-C 08/01/15 0133  Rolland Porter, MD 08/10/15 [phone]

## 2015-08-29 ENCOUNTER — Encounter: Payer: Self-pay | Admitting: "Endocrinology

## 2015-09-09 ENCOUNTER — Encounter (HOSPITAL_COMMUNITY): Payer: Self-pay | Admitting: *Deleted

## 2015-09-09 ENCOUNTER — Emergency Department (HOSPITAL_COMMUNITY): Payer: Medicaid Other

## 2015-09-09 ENCOUNTER — Emergency Department (HOSPITAL_COMMUNITY)
Admission: EM | Admit: 2015-09-09 | Discharge: 2015-09-09 | Disposition: A | Discharge status: Home / Self Care | Payer: Medicaid Other | Attending: Emergency Medicine | Admitting: Emergency Medicine

## 2015-09-09 DIAGNOSIS — Z91040 Latex allergy status: Secondary | ICD-10-CM | POA: Diagnosis not present

## 2015-09-09 DIAGNOSIS — Z9851 Tubal ligation status: Secondary | ICD-10-CM | POA: Diagnosis not present

## 2015-09-09 DIAGNOSIS — R0789 Other chest pain: Secondary | ICD-10-CM | POA: Insufficient documentation

## 2015-09-09 DIAGNOSIS — R1084 Generalized abdominal pain: Secondary | ICD-10-CM | POA: Diagnosis not present

## 2015-09-09 DIAGNOSIS — J209 Acute bronchitis, unspecified: Secondary | ICD-10-CM | POA: Insufficient documentation

## 2015-09-09 DIAGNOSIS — R197 Diarrhea, unspecified: Secondary | ICD-10-CM | POA: Insufficient documentation

## 2015-09-09 DIAGNOSIS — R05 Cough: Secondary | ICD-10-CM | POA: Diagnosis present

## 2015-09-09 DIAGNOSIS — J020 Streptococcal pharyngitis: Secondary | ICD-10-CM | POA: Diagnosis not present

## 2015-09-09 DIAGNOSIS — F17210 Nicotine dependence, cigarettes, uncomplicated: Secondary | ICD-10-CM | POA: Diagnosis not present

## 2015-09-09 DIAGNOSIS — J029 Acute pharyngitis, unspecified: Secondary | ICD-10-CM

## 2015-09-09 LAB — RAPID STREP SCREEN (MED CTR MEBANE ONLY): STREPTOCOCCUS, GROUP A SCREEN (DIRECT): NEGATIVE

## 2015-09-09 MED ORDER — ALBUTEROL SULFATE (2.5 MG/3ML) 0.083% IN NEBU
2.5000 mg | INHALATION_SOLUTION | Freq: Once | RESPIRATORY_TRACT | Status: AC
  Administered 2015-09-09: 2.5 mg via RESPIRATORY_TRACT

## 2015-09-09 MED ORDER — IPRATROPIUM BROMIDE 0.02 % IN SOLN: 0.5000 mg | Freq: Once | RESPIRATORY_TRACT | Status: DC

## 2015-09-09 MED ORDER — DM-GUAIFENESIN ER 30-600 MG PO TB12
1.0000 | ORAL_TABLET | Freq: Two times a day (BID) | ORAL | Status: DC
  Administered 2015-09-09: 1 via ORAL
  Filled 2015-09-09: qty 1

## 2015-09-09 MED ORDER — ALBUTEROL SULFATE HFA 108 (90 BASE) MCG/ACT IN AERS
2.0000 | INHALATION_SPRAY | RESPIRATORY_TRACT | Status: DC | PRN
  Filled 2015-09-09: qty 6.7

## 2015-09-09 MED ORDER — ALBUTEROL SULFATE (2.5 MG/3ML) 0.083% IN NEBU
INHALATION_SOLUTION | RESPIRATORY_TRACT | Status: AC
  Filled 2015-09-09: qty 3

## 2015-09-09 MED ORDER — PREDNISONE 20 MG PO TABS: ORAL_TABLET | ORAL | Status: DC

## 2015-09-09 MED ORDER — AZITHROMYCIN 250 MG PO TABS: ORAL_TABLET | ORAL | Status: DC

## 2015-09-09 MED ORDER — PREDNISONE 50 MG PO TABS
60.0000 mg | ORAL_TABLET | Freq: Once | ORAL | Status: AC
  Administered 2015-09-09: 60 mg via ORAL
  Filled 2015-09-09: qty 1

## 2015-09-09 MED ORDER — ALBUTEROL SULFATE (2.5 MG/3ML) 0.083% IN NEBU: 5.0000 mg | INHALATION_SOLUTION | Freq: Once | RESPIRATORY_TRACT | Status: DC

## 2015-09-09 MED ORDER — IPRATROPIUM-ALBUTEROL 0.5-2.5 (3) MG/3ML IN SOLN
RESPIRATORY_TRACT | Status: AC
  Filled 2015-09-09: qty 3

## 2015-09-09 MED ORDER — IPRATROPIUM-ALBUTEROL 0.5-2.5 (3) MG/3ML IN SOLN
3.0000 mL | Freq: Once | RESPIRATORY_TRACT | Status: AC
  Administered 2015-09-09: 3 mL via RESPIRATORY_TRACT

## 2015-09-09 MED ORDER — AEROCHAMBER Z-STAT PLUS/MEDIUM MISC: 1.0000 | Freq: Once | Status: DC

## 2015-09-09 NOTE — Discharge Instructions (Signed)
Drink plenty of fluids. Monitor yourself for fever, if you get a fever take ibuprofen 600 mg and/or acetaminophen 1000 mg every 6 hrs. Use the inhaler 2 puffs every 4-6 hrs as needed for shortness of breath or wheezing. You can take mucinex DM OTC for cough. Take the antibiotics and prednisone until gone. Recheck if you get worse, such as struggling to breathe despite using the inhaler, high fever, vomiting.   Metered Dose Inhaler With Spacer Inhaled medicines are the basis of treatment of asthma and other breathing problems. Inhaled medicine can only be effective if used properly. Good technique assures that the medicine reaches the lungs. Your health care provider has asked you to use a spacer with your inhaler to help you take the medicine more effectively. A spacer is a plastic tube with a mouthpiece on one end and an opening that connects to the inhaler on the other end. Metered dose inhalers (MDIs) are used to deliver a variety of inhaled medicines. These include quick relief or rescue medicines (such as bronchodilators) and controller medicines (such as corticosteroids). The medicine is delivered by pushing down on a metal canister to release a set amount of spray. If you are using different kinds of inhalers, use your quick relief medicine to open the airways 10-15 minutes before using a steroid if instructed to do so by your health care provider. If you are unsure which inhalers to use and the order of using them, ask your health care provider, nurse, or respiratory therapist. HOW TO USE THE INHALER WITH A SPACER  Remove cap from inhaler.  If you are using the inhaler for the first time, you will need to prime it. Shake the inhaler for 5 seconds and release four puffs into the air, away from your face. Ask your health care provider or pharmacist if you have questions about priming your inhaler.  Shake inhaler for 5 seconds before each breath in (inhalation).  Place the open end of the spacer  onto the mouthpiece of the inhaler.  Position the inhaler so that the top of the canister faces up and the spacer mouthpiece faces you.  Put your index finger on the top of the medicine canister. Your thumb supports the bottom of the inhaler and the spacer.  Breathe out (exhale) normally and as completely as possible.  Immediately after exhaling, place the spacer between your teeth and into your mouth. Close your mouth tightly around the spacer.  Press the canister down with the index finger to release the medicine.  At the same time as the canister is pressed, inhale deeply and slowly until the lungs are completely filled. This should take 4-6 seconds. Keep your tongue down and out of the way.  Hold the medicine in your lungs for 5-10 seconds (10 seconds is best). This helps the medicine get into the small airways of your lungs. Exhale.  Repeat inhaling deeply through the spacer mouthpiece. Again hold that breath for up to 10 seconds (10 seconds is best). Exhale slowly. If it is difficult to take this second deep breath through the spacer, breathe normally several times through the spacer. Remove the spacer from your mouth.  Wait at least 15-30 seconds between puffs. Continue with the above steps until you have taken the number of puffs your health care provider has ordered. Do not use the inhaler more than your health care provider directs you to.  Remove spacer from the inhaler and place cap on inhaler.  Follow the directions from  your health care provider or the inhaler insert for cleaning the inhaler and spacer. If you are using a steroid inhaler, rinse your mouth with water after your last puff, gargle, and spit out the water. Do not swallow the water. AVOID:  Inhaling before or after starting the spray of medicine. It takes practice to coordinate your breathing with triggering the spray.  Inhaling through the nose (rather than the mouth) when triggering the spray. HOW TO DETERMINE  IF YOUR INHALER IS FULL OR NEARLY EMPTY You cannot know when an inhaler is empty by shaking it. A few inhalers are now being made with dose counters. Ask your health care provider for a prescription that has a dose counter if you feel you need that extra help. If your inhaler does not have a counter, ask your health care provider to help you determine the date you need to refill your inhaler. Write the refill date on a calendar or your inhaler canister. Refill your inhaler 7-10 days before it runs out. Be sure to keep an adequate supply of medicine. This includes making sure it is not expired, and you have a spare inhaler.  SEEK MEDICAL CARE IF:   Symptoms are only partially relieved with your inhaler.  You are having trouble using your inhaler.  You experience some increase in phlegm. SEEK IMMEDIATE MEDICAL CARE IF:   You feel little or no relief with your inhalers. You are still wheezing and are feeling shortness of breath or tightness in your chest or both.  You have dizziness, headaches, or fast heart rate.  You have chills, fever, or night sweats.  There is a noticeable increase in phlegm production, or there is blood in the phlegm.   This information is not intended to replace advice given to you by your health care provider. Make sure you discuss any questions you have with your health care provider.   Document Released: 07/20/2005 Document Revised: 12/04/2014 Document Reviewed: 01/05/2013 Elsevier Interactive Patient Education 2016 [employer]  Strep Throat Strep throat is an infection of the throat. It is caused by germs. Strep throat spreads from person to person because of coughing, sneezing, or close contact. HOME CARE Medicines  Take over-the-counter and prescription medicines only as told by your doctor.  Take your antibiotic medicine as told by your doctor. Do not stop taking the medicine even if you feel better.  Have family members who also have a sore throat or  fever go to a doctor. Eating and Drinking  Do not share food, drinking cups, or personal items.  Try eating soft foods until your sore throat feels better.  Drink enough fluid to keep your pee (urine) clear or pale yellow. General Instructions  Rinse your mouth (gargle) with a salt-water mixture 3-4 times per day or as needed. To make a salt-water mixture, stir -1 tsp of salt into 1 cup of warm water.  Make sure that all people in your house wash their hands well.  Rest.  Stay home from school or work until you have been taking antibiotics for 24 hours.  Keep all follow-up visits as told by your doctor. This is important. GET HELP IF:  Your neck keeps getting bigger.  You get a rash, cough, or earache.  You cough up thick liquid that is green, yellow-brown, or bloody.  You have pain that does not get better with medicine.  Your problems get worse instead of getting better.  You have a fever. GET HELP RIGHT AWAY IF:  You throw up (vomit).  You get a very bad headache.  You neck hurts or it feels stiff.  You have chest pain or you are short of breath.  You have drooling, very bad throat pain, or changes in your voice.  Your neck is swollen or the skin gets red and tender.  Your mouth is dry or you are peeing less than normal.  You keep feeling more tired or it is hard to wake up.  Your joints are red or they hurt.   This information is not intended to replace advice given to you by your health care provider. Make sure you discuss any questions you have with your health care provider.   Document Released: 01/06/2008 Document Revised: 04/10/2015 Document Reviewed: 11/12/2014 Elsevier Interactive Patient Education [employer]

## 2015-09-09 NOTE — ED Notes (Signed)
Pt c/o productive cough x 5 days; pt states she has been having fever and chills

## 2015-09-09 NOTE — ED Notes (Signed)
Pt c/o productive cough with white to yellow sputum production for the past week along with sore throat and chills,

## 2015-09-09 NOTE — ED Notes (Signed)
Pt expressed understanding of discharge instructions,  

## 2015-09-09 NOTE — ED Notes (Signed)
Dr Knapp at bedside,  

## 2015-09-09 NOTE — ED Provider Notes (Signed)
CSN: [id number]     Arrival date & time 09/09/15  0039 History  By signing my name below, I, Tanda Rockers, attest that this documentation has been prepared under the direction and in the presence of Devoria Albe, MD at 0045. Electronically Signed: Tanda Rockers, ED Scribe. 09/09/2015. 12:54 AM.   Chief Complaint  Patient presents with  . Cough   The history is provided by the patient. No language interpreter was used.     HPI Comments: Phyllis Daniels is a 35 y.o. female who presents to the Emergency Department complaining of gradual onset, intermittent, productive cough with white/yellow phlegm x 6 days. Pt reports anterior chest wall pain described as soreness with coughing, shortness of breath with coughing, wheezing, chills, nasal congestion without rhinorrhea, right sided sore throat that started today, and loose/watery diarrhea with 3-4 episodes per day. She also complains of burning, diffuse abdominal pain x 2 days without reflux of acid fluid into her chest or throat. She mentions having a fever of 101 two days ago. Pt did receive flu vaccine this year. No recent sick contact with similar symptoms. Denies burning sensation in throat or mouth, nausea, vomiting, rhinorrhea, ear pain, or any other associated symptoms. No FHx asthma. Pt is current everyday smoker who smokes 6 cigarettes per day. Non EtOH drinker.   PCP Dr Felecia Shelling  Past Medical History  Diagnosis Date  . Back pain    Past Surgical History  Procedure Laterality Date  . Tubal ligation     History reviewed. No pertinent family history. Social History  Substance Use Topics  . Smoking status: Current Every Day Smoker -- 0.50 packs/day    Types: Cigarettes  . Smokeless tobacco: None  . 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  HENT: Positive for congestion and sore throat. Negative for ear pain and  rhinorrhea.   Respiratory: Positive for cough, shortness of breath and wheezing.   Cardiovascular: Positive for chest pain.  Gastrointestinal: Positive for diarrhea. Negative for nausea and vomiting.  All other systems reviewed and are negative.  Allergies  Tuberculin tests and Latex  Home Medications   none  BP 156/102 mmHg  Pulse 92  Temp(Src) 98.8 F (37.1 C) (Oral)  Resp 20  Ht 6' (1.829 m)  Wt 354 lb (160.573 kg)  BMI 48.00 kg/m2  SpO2 98%  Vital signs normal except for hypertension    Physical Exam  Constitutional: She is oriented to person, place, and time. She appears well-developed and well-nourished.  Non-toxic appearance. She does not appear ill. No distress.  Morbidly obese  HENT:  Head: Normocephalic and atraumatic.  Right Ear: External ear normal.  Left Ear: External ear normal.  Nose: Nose normal. No mucosal edema or rhinorrhea.  Mouth/Throat: Mucous membranes are normal. No dental abscesses or uvula swelling. Posterior oropharyngeal edema and posterior oropharyngeal erythema present.  Mild swelling of the right tonsil with redness  Eyes: Conjunctivae and EOM are normal. Pupils are equal, round, and reactive to light.  Neck: Normal range of motion and full passive range of motion without pain. Neck supple.  Cardiovascular: Normal rate, regular rhythm and normal heart sounds.  Exam reveals no gallop and no friction rub.   No murmur heard. Pulmonary/Chest: Effort normal and breath sounds normal. No respiratory distress. She has no wheezes. She has no rhonchi. She has no  rales. She exhibits no tenderness and no crepitus.  Pt coughing on exam  Abdominal: Soft. Normal appearance and bowel sounds are normal. She exhibits no distension. There is no tenderness. There is no rebound and no guarding.  Musculoskeletal: Normal range of motion. She exhibits no edema or tenderness.  Moves all extremities well.   Neurological: She is alert and oriented to person, place,  and time. She has normal strength. No cranial nerve deficit.  Skin: Skin is warm, dry and intact. No rash noted. No erythema. No pallor.  Psychiatric: She has a normal mood and affect. Her speech is normal and behavior is normal. Her mood appears not anxious.  Nursing note and vitals reviewed.   ED Course  Procedures (including critical care time)  Medications  dextromethorphan-guaiFENesin (MUCINEX DM) 30-600 MG per 12 hr tablet 1 tablet (1 tablet Oral Given 09/09/15 0151)  ipratropium-albuterol (DUONEB) 0.5-2.5 (3) MG/3ML nebulizer solution (not administered)  albuterol (PROVENTIL HFA;VENTOLIN HFA) 108 (90 Base) MCG/ACT inhaler 2 puff (not administered)  aerochamber Z-Stat Plus/medium 1 each (not administered)  predniSONE (DELTASONE) tablet 60 mg (not administered)  albuterol (PROVENTIL) (2.5 MG/3ML) 0.083% nebulizer solution 2.5 mg (2.5 mg Nebulization Given 09/09/15 0155)  ipratropium-albuterol (DUONEB) 0.5-2.5 (3) MG/3ML nebulizer solution 3 mL (3 mLs Nebulization Given 09/09/15 0155)      DIAGNOSTIC STUDIES: Oxygen Saturation is 98% on RA, normal by my interpretation.    COORDINATION OF CARE: 12:53 AM-Discussed treatment plan which includes rapid strep test and CXR with pt at bedside and pt agreed to plan.   140 AM patient was given her test results. We discussed trying a nebulizer treatment to see if that would help with her feeling of having wheezing and shortness of breath and she is agreeable.  Recheck after her nebulizer patient states she's feeling much improved. We discussed sending her home with a inhaler. She was also given a spacer. She was started on steroids and given a prescription for Z-Pak and prednisone as an outpatient.   Imaging Review Dg Chest 2 View  09/09/2015  CLINICAL DATA:  35 year old female with productive cough EXAM: CHEST  2 VIEW COMPARISON:  Radiograph dated 12/25/2014 FINDINGS: The heart size and mediastinal contours are within normal limits. Both lungs  are clear. The visualized skeletal structures are unremarkable. IMPRESSION: No active cardiopulmonary disease. Electronically Signed   By: Elgie Collard M.D.   On: 09/09/2015 01:15   I have personally reviewed and evaluated these images and lab results as part of my medical decision-making.    MDM   Final diagnoses:  Bronchitis with bronchospasm  Sore throat    New Prescriptions   AZITHROMYCIN (ZITHROMAX) 250 MG TABLET    Take 2 po the first day then once a day for the next 4 days.   PREDNISONE (DELTASONE) 20 MG TABLET    Take 3 po QD x 3d , then 2 po QD x 3d then 1 po QD x 3d    Plan discharge  Devoria Albe, MD, FACEP  I personally performed the services described in this documentation, which was scribed in my presence. The recorded information has been reviewed and considered.  Devoria Albe, MD, Concha Pyo, MD 09/09/15 [phone]

## 2015-09-11 LAB — CULTURE, GROUP A STREP (THRC)

## 2015-09-13 ENCOUNTER — Ambulatory Visit: Payer: Medicaid Other | Admitting: Anesthesiology

## 2015-10-09 ENCOUNTER — Ambulatory Visit: Payer: Medicaid Other | Admitting: Anesthesiology

## 2015-10-10 ENCOUNTER — Ambulatory Visit: Payer: Medicaid Other | Attending: Anesthesiology | Admitting: Anesthesiology

## 2015-10-10 ENCOUNTER — Encounter: Payer: Self-pay | Admitting: Anesthesiology

## 2015-10-10 VITALS — BP 113/95 | HR 91 | Temp 98.5°F | Resp 16 | Ht 72.0 in | Wt 354.0 lb

## 2015-10-10 DIAGNOSIS — M5116 Intervertebral disc disorders with radiculopathy, lumbar region: Secondary | ICD-10-CM | POA: Diagnosis not present

## 2015-10-10 DIAGNOSIS — G8929 Other chronic pain: Secondary | ICD-10-CM | POA: Diagnosis not present

## 2015-10-10 DIAGNOSIS — M545 Low back pain, unspecified: Secondary | ICD-10-CM | POA: Insufficient documentation

## 2015-10-10 DIAGNOSIS — M5137 Other intervertebral disc degeneration, lumbosacral region: Secondary | ICD-10-CM

## 2015-10-10 DIAGNOSIS — M47896 Other spondylosis, lumbar region: Secondary | ICD-10-CM | POA: Insufficient documentation

## 2015-10-10 DIAGNOSIS — F17210 Nicotine dependence, cigarettes, uncomplicated: Secondary | ICD-10-CM | POA: Insufficient documentation

## 2015-10-10 DIAGNOSIS — M5417 Radiculopathy, lumbosacral region: Secondary | ICD-10-CM | POA: Insufficient documentation

## 2015-10-10 MED ORDER — MELOXICAM 7.5 MG PO TABS: 7.5000 mg | ORAL_TABLET | Freq: Two times a day (BID) | ORAL | Status: DC

## 2015-10-10 NOTE — Progress Notes (Signed)
Subjective:    Patient ID: Phyllis Daniels, female    DOB: 1980-09-06, 35 y.o.   MRN: [id number]  HPI  This patient pleasant and very delightful 35 year old lady who comes in complaining of chronic low back pain Patient indicates that the pain goes down both legs to both thighs both feet and causes numbness in all the toes of both feet She indicates that the left is much worse than the right This pain is not associated with any trauma but began spontaneously She describes the pain as sharp and stabbing and piercing and constant in the back but intermittent in the legs She has had treatments in the emergency room with various intramuscular injections and the one that she recalls is Toradol  Pain intensity rating Subjective pain intensity rating is 85% The pain is not relieved by anything in particular The pain is aggravated by prolonged sitting and prolonged standing  Pain medications [name] takes no pain medications  Other medications Patient does not take any other medications  Allergies She is allergic to the tuberculin injection give him for tuberculosis  Past medical history Has a history of bronchitis and multiple upper respiratory tract infection  Past surgical history Her surgical history is positive for one cesarean section and  tubal ligation  Social and economic history Patient has been smoking for the past 16 years and she's been smoking about half pack of cigarettes per day. Currently she is down to smoking about 45 cigarettes a day and is trying to completely stop smoking She uses alcohol occasionally She currently does not use any illicit drugs.  She used marijuana  as a teenager and a young adult  up until the age of 35 years She works part-time as a Radio broadcast assistant and she is currently going to psychology school  Family history She is currently separated from her husband She is para 3+0 She has 4 children because 2 of them are twins and they're ages 36  and 15 years Her mother is age 49 and is alive but has hypertension Her father is alive and at age 34 but has acid reflux He has no brothers She has one sister age 76 who has systemic lupus erythematosus She'll he lives with her children    Review of Systems  Constitutional: Negative.  Negative for fever, chills, diaphoresis, activity change, appetite change, fatigue and unexpected weight change.  HENT: Negative.  Negative for congestion, dental problem, drooling, ear discharge, ear pain, facial swelling, hearing loss, mouth sores, nosebleeds, postnasal drip, rhinorrhea, sinus pressure, sneezing, sore throat, tinnitus, trouble swallowing and voice change.   Eyes: Negative.  Negative for photophobia, pain, discharge, redness, itching and visual disturbance.  Respiratory: Negative.  Negative for apnea, cough, choking, chest tightness, shortness of breath, wheezing and stridor.        She has a long-standing history of bronchitis and multiple upper respiratory tract infections  Cardiovascular: Negative for chest pain, palpitations and leg swelling.  Gastrointestinal: Negative.  Negative for nausea, vomiting, abdominal pain, diarrhea, constipation, blood in stool, abdominal distention, anal bleeding and rectal pain.  Endocrine: Negative.  Negative for cold intolerance, heat intolerance, polydipsia, polyphagia and polyuria.  Genitourinary: Negative.  Negative for dysuria, urgency, frequency, hematuria, flank pain, decreased urine volume, enuresis, genital sores, menstrual problem, pelvic pain and dyspareunia.  Musculoskeletal: Positive for myalgias, back pain, arthralgias and gait problem. Negative for joint swelling, neck pain and neck stiffness.  Skin: Negative.  Negative for color change, pallor, rash and wound.  Allergic/Immunologic: Negative.  Negative for environmental allergies, food allergies and immunocompromised state.  Neurological: Negative.  Negative for dizziness, tremors, seizures,  syncope, facial asymmetry, speech difficulty, weakness, light-headedness, numbness and headaches.  Hematological: Negative.  Negative for adenopathy. Does not bruise/bleed easily.  Psychiatric/Behavioral: Negative.  Negative for suicidal ideas, hallucinations, behavioral problems, confusion, sleep disturbance, self-injury, dysphoric mood, decreased concentration and agitation. The patient is not nervous/anxious and is not hyperactive.        Objective:   Physical Exam  Constitutional: She is oriented to person, place, and time. She appears well-developed and well-nourished. No distress.  This is a heavyset woman with a large frame Her height is 6 feet Her weight is 354 pounds  HENT:  Head: Normocephalic and atraumatic.  Right Ear: External ear normal.  Left Ear: External ear normal.  Nose: Nose normal.  Mouth/Throat: Oropharynx is clear and moist. No oropharyngeal exudate.  Eyes: EOM are normal. Pupils are equal, round, and reactive to light. Right eye exhibits no discharge. Left eye exhibits no discharge. No scleral icterus.  Neck: Normal range of motion. Neck supple. No JVD present. No tracheal deviation present. No thyromegaly present.  Cardiovascular: Normal rate, regular rhythm, normal heart sounds and intact distal pulses.  Exam reveals no friction rub.   No murmur heard. Pulmonary/Chest: Effort normal and breath sounds normal. No respiratory distress. She has no wheezes. She has no rales. She exhibits no tenderness.  Abdominal: Soft. Bowel sounds are normal. She exhibits no distension and no mass. There is no tenderness. There is no rebound and no guarding.  Genitourinary:  Genitourinary examination was deferred  Musculoskeletal: She exhibits tenderness. She exhibits no edema.  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  Neurological: She is alert and oriented to person, place, and time. She has normal reflexes. She displays normal reflexes. No cranial nerve deficit. She exhibits normal muscle tone. Coordination normal.  Skin: Skin is warm and dry. No rash noted. She is not diaphoretic. No erythema. No pallor.  Psychiatric: She has a normal mood and affect. Her behavior is normal.  Nursing note and vitals reviewed.   MRI summary Patient had a lumbar MRI and the results showed that there was lumbar spondylosis and degenerative disc disease causing mild to moderate impingement at L5 and S1 and mild impingement at L4 and L5      Assessment & Plan:    Assessment 1 chronic low back pain 2 lumbar degenerative disc disease 3 lumbar radiculopathy   Plan of management 1 diagnostic caudal epidural steroid injection 2 review of the MRI results 3 plan a left lumbar transforaminal epidural steroid injection at L4-L5 and S1 4 prescribed meloxicam 7.5 mg twice a day after meals and give 42 tablets 5 if that is not effective then we can give her tramadol 50 mg 3 times a day   New patient      Level  4    Zander Ingham M.D.

## 2015-10-10 NOTE — Progress Notes (Signed)
Safety precautions to be maintained throughout the outpatient stay will include: orient to surroundings, keep bed in low position, maintain call bell within reach at all times, provide assistance with transfer out of bed and ambulation.  

## 2015-10-10 NOTE — Patient Instructions (Signed)
Epidural Steroid Injection Patient Information  Description: The epidural space surrounds the nerves as they exit the spinal cord.  In some patients, the nerves can be compressed and inflamed by a bulging disc or a tight spinal canal (spinal stenosis).  By injecting steroids into the epidural space, we can bring irritated nerves into direct contact with a potentially helpful medication.  These steroids act directly on the irritated nerves and can reduce swelling and inflammation which often leads to decreased pain.  Epidural steroids may be injected anywhere along the spine and from the neck to the low back depending upon the location of your pain.   After numbing the skin with local anesthetic (like Novocaine), a small needle is passed into the epidural space slowly.  You may experience a sensation of pressure while this is being done.  The entire block usually last less than 10 minutes.  Conditions which may be treated by epidural steroids:   Low back and leg pain  Neck and arm pain  Spinal stenosis  Post-laminectomy syndrome  Herpes zoster (shingles) pain  Pain from compression fractures  Preparation for the injection:  1. Do not eat any solid food or dairy products within 8 hours of your appointment.  2. You may drink clear liquids up to 3 hours before appointment.  Clear liquids include water, black coffee, juice or soda.  No milk or cream please. 3. You may take your regular medication, including pain medications, with a sip of water before your appointment  Diabetics should hold regular insulin (if taken separately) and take 1/2 normal NPH dos the morning of the procedure.  Carry some sugar containing items with you to your appointment. 4. A driver must accompany you and be prepared to drive you home after your procedure.  5. Bring all your current medications with your. 6. An IV may be inserted and sedation may be given at the discretion of the physician.   7. A blood pressure  cuff, EKG and other monitors will often be applied during the procedure.  Some patients may need to have extra oxygen administered for a short period. 8. You will be asked to provide medical information, including your allergies, prior to the procedure.  We must know immediately if you are taking blood thinners (like Coumadin/Warfarin)  Or if you are allergic to IV iodine contrast (dye). We must know if you could possible be pregnant.  Possible side-effects:  Bleeding from needle site  Infection (rare, may require surgery)  Nerve injury (rare)  Numbness & tingling (temporary)  Difficulty urinating (rare, temporary)  Spinal headache ( a headache worse with upright posture)  Light -headedness (temporary)  Pain at injection site (several days)  Decreased blood pressure (temporary)  Weakness in arm/leg (temporary)  Pressure sensation in back/neck (temporary)  Call if you experience:  Fever/chills associated with headache or increased back/neck pain.  Headache worsened by an upright position.  New onset weakness or numbness of an extremity below the injection site  Hives or difficulty breathing (go to the emergency room)  Inflammation or drainage at the infection site  Severe back/neck pain  Any new symptoms which are concerning to you  Please note:  Although the local anesthetic injected can often make your back or neck feel good for several hours after the injection, the pain will likely return.  It takes 3-7 days for steroids to work in the epidural space.  You may not notice any pain relief for at least that one week.    If effective, we will often do a series of three injections spaced 3-6 weeks apart to maximally decrease your pain.  After the initial series, we generally will wait several months before considering a repeat injection of the same type.  If you have any questions, please call [phone] [facility] 

## 2015-10-17 LAB — TOXASSURE SELECT 13 (MW), URINE: PDF: 0

## 2015-11-08 ENCOUNTER — Ambulatory Visit: Payer: Medicaid Other | Admitting: Anesthesiology

## 2016-01-21 ENCOUNTER — Emergency Department (HOSPITAL_COMMUNITY): Payer: Self-pay

## 2016-01-21 ENCOUNTER — Other Ambulatory Visit: Payer: Self-pay

## 2016-01-21 ENCOUNTER — Emergency Department (HOSPITAL_COMMUNITY)
Admission: EM | Admit: 2016-01-21 | Discharge: 2016-01-22 | Disposition: A | Discharge status: Home / Self Care | Payer: Self-pay | Attending: Emergency Medicine | Admitting: Emergency Medicine

## 2016-01-21 ENCOUNTER — Encounter (HOSPITAL_COMMUNITY): Payer: Self-pay | Admitting: Emergency Medicine

## 2016-01-21 DIAGNOSIS — Y92410 Unspecified street and highway as the place of occurrence of the external cause: Secondary | ICD-10-CM | POA: Insufficient documentation

## 2016-01-21 DIAGNOSIS — Y999 Unspecified external cause status: Secondary | ICD-10-CM | POA: Insufficient documentation

## 2016-01-21 DIAGNOSIS — S60222A Contusion of left hand, initial encounter: Secondary | ICD-10-CM | POA: Insufficient documentation

## 2016-01-21 DIAGNOSIS — M25512 Pain in left shoulder: Secondary | ICD-10-CM | POA: Insufficient documentation

## 2016-01-21 DIAGNOSIS — R55 Syncope and collapse: Secondary | ICD-10-CM | POA: Insufficient documentation

## 2016-01-21 DIAGNOSIS — F17210 Nicotine dependence, cigarettes, uncomplicated: Secondary | ICD-10-CM | POA: Insufficient documentation

## 2016-01-21 DIAGNOSIS — R05 Cough: Secondary | ICD-10-CM | POA: Insufficient documentation

## 2016-01-21 DIAGNOSIS — Z79899 Other long term (current) drug therapy: Secondary | ICD-10-CM | POA: Insufficient documentation

## 2016-01-21 DIAGNOSIS — Y939 Activity, unspecified: Secondary | ICD-10-CM | POA: Insufficient documentation

## 2016-01-21 DIAGNOSIS — R054 Cough syncope: Secondary | ICD-10-CM

## 2016-01-21 DIAGNOSIS — S0083XA Contusion of other part of head, initial encounter: Secondary | ICD-10-CM | POA: Insufficient documentation

## 2016-01-21 DIAGNOSIS — Z91040 Latex allergy status: Secondary | ICD-10-CM | POA: Insufficient documentation

## 2016-01-21 LAB — URINALYSIS, ROUTINE W REFLEX MICROSCOPIC
Bilirubin Urine: NEGATIVE
Glucose, UA: NEGATIVE mg/dL
Ketones, ur: NEGATIVE mg/dL
LEUKOCYTES UA: NEGATIVE
NITRITE: NEGATIVE
PH: 6.5 (ref 5.0–8.0)
Protein, ur: 30 mg/dL — AB
SPECIFIC GRAVITY, URINE: 1.027 (ref 1.005–1.030)

## 2016-01-21 LAB — URINE MICROSCOPIC-ADD ON

## 2016-01-21 LAB — CBC WITH DIFFERENTIAL/PLATELET
BASOS PCT: 0 %
Basophils Absolute: 0 10*3/uL (ref 0.0–0.1)
Eosinophils Absolute: 0.3 10*3/uL (ref 0.0–0.7)
Eosinophils Relative: 4 %
HCT: 38.3 % (ref 36.0–46.0)
HEMOGLOBIN: 11.8 g/dL — AB (ref 12.0–15.0)
LYMPHS ABS: 2.6 10*3/uL (ref 0.7–4.0)
LYMPHS PCT: 32 %
MCH: 27 pg (ref 26.0–34.0)
MCHC: 30.8 g/dL (ref 30.0–36.0)
MCV: 87.6 fL (ref 78.0–100.0)
MONO ABS: 0.5 10*3/uL (ref 0.1–1.0)
MONOS PCT: 6 %
NEUTROS ABS: 4.7 10*3/uL (ref 1.7–7.7)
Neutrophils Relative %: 58 %
Platelets: 276 10*3/uL (ref 150–400)
RBC: 4.37 MIL/uL (ref 3.87–5.11)
RDW: 13.9 % (ref 11.5–15.5)
WBC: 8.1 10*3/uL (ref 4.0–10.5)

## 2016-01-21 LAB — COMPREHENSIVE METABOLIC PANEL
ALBUMIN: 3.4 g/dL — AB (ref 3.5–5.0)
ALK PHOS: 101 U/L (ref 38–126)
ALT: 31 U/L (ref 14–54)
ANION GAP: 5 (ref 5–15)
AST: 27 U/L (ref 15–41)
BUN: 7 mg/dL (ref 6–20)
CALCIUM: 9 mg/dL (ref 8.9–10.3)
CHLORIDE: 108 mmol/L (ref 101–111)
CO2: 26 mmol/L (ref 22–32)
Creatinine, Ser: 0.81 mg/dL (ref 0.44–1.00)
GFR calc Af Amer: >60 mL/min (ref >60)
GFR calc non Af Amer: >60 mL/min (ref >60)
GLUCOSE: 114 mg/dL — AB (ref 65–99)
POTASSIUM: 3.9 mmol/L (ref 3.5–5.1)
SODIUM: 139 mmol/L (ref 135–145)
Total Bilirubin: 0.4 mg/dL (ref 0.3–1.2)
Total Protein: 6.6 g/dL (ref 6.5–8.1)

## 2016-01-21 LAB — POC URINE PREG, ED: Preg Test, Ur: NEGATIVE
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
## 2016-01-21 NOTE — ED Notes (Signed)
Pt ambulated with steady gait in hallway 

## 2016-01-22 MED ORDER — METHOCARBAMOL 500 MG PO TABS: 1000.0000 mg | ORAL_TABLET | Freq: Four times a day (QID) | ORAL | Status: DC

## 2016-01-22 MED ORDER — NAPROXEN 500 MG PO TABS: 500.0000 mg | ORAL_TABLET | Freq: Two times a day (BID) | ORAL | Status: DC
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
## 2016-03-26 ENCOUNTER — Emergency Department (HOSPITAL_COMMUNITY)
Admission: EM | Admit: 2016-03-26 | Discharge: 2016-03-26 | Disposition: A | Discharge status: Home / Self Care | Payer: Medicaid Other | Attending: Emergency Medicine | Admitting: Emergency Medicine

## 2016-03-26 ENCOUNTER — Encounter (HOSPITAL_COMMUNITY): Payer: Self-pay | Admitting: *Deleted

## 2016-03-26 DIAGNOSIS — F17210 Nicotine dependence, cigarettes, uncomplicated: Secondary | ICD-10-CM | POA: Insufficient documentation

## 2016-03-26 DIAGNOSIS — Z79899 Other long term (current) drug therapy: Secondary | ICD-10-CM | POA: Insufficient documentation

## 2016-03-26 DIAGNOSIS — M5432 Sciatica, left side: Secondary | ICD-10-CM | POA: Insufficient documentation

## 2016-03-26 MED ORDER — METHOCARBAMOL 500 MG PO TABS: 1000.0000 mg | ORAL_TABLET | Freq: Four times a day (QID) | ORAL | 0 refills | Status: AC

## 2016-03-26 MED ORDER — HYDROMORPHONE HCL 1 MG/ML IJ SOLN
1.0000 mg | Freq: Once | INTRAMUSCULAR | Status: AC
  Administered 2016-03-26: 1 mg via INTRAMUSCULAR
  Filled 2016-03-26: qty 1

## 2016-03-26 MED ORDER — KETOROLAC TROMETHAMINE 60 MG/2ML IM SOLN
60.0000 mg | Freq: Once | INTRAMUSCULAR | Status: AC
  Administered 2016-03-26: 60 mg via INTRAMUSCULAR
  Filled 2016-03-26: qty 2

## 2016-03-26 MED ORDER — DICLOFENAC SODIUM 75 MG PO TBEC: 75.0000 mg | DELAYED_RELEASE_TABLET | Freq: Two times a day (BID) | ORAL | 0 refills | Status: DC

## 2016-03-26 NOTE — ED Notes (Signed)
Pt made aware to return if symptoms worsen or if any life threatening symptoms occur.   

## 2016-03-26 NOTE — ED Triage Notes (Signed)
Pt has chronic back pain that she went to the pain clinic for. She no longer goes there and is having trouble tolerating her pain. Pt states she woke up this morning with worsening pain. NAD noted. Pt ambulatory.
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
## 2016-06-28 ENCOUNTER — Encounter (HOSPITAL_COMMUNITY): Payer: Self-pay | Admitting: Emergency Medicine

## 2016-06-28 ENCOUNTER — Emergency Department (HOSPITAL_COMMUNITY)
Admission: EM | Admit: 2016-06-28 | Discharge: 2016-06-28 | Disposition: A | Discharge status: Home / Self Care | Payer: Self-pay | Attending: Emergency Medicine | Admitting: Emergency Medicine

## 2016-06-28 DIAGNOSIS — L0501 Pilonidal cyst with abscess: Secondary | ICD-10-CM | POA: Insufficient documentation

## 2016-06-28 DIAGNOSIS — F17210 Nicotine dependence, cigarettes, uncomplicated: Secondary | ICD-10-CM | POA: Insufficient documentation

## 2016-06-28 DIAGNOSIS — Z91040 Latex allergy status: Secondary | ICD-10-CM | POA: Insufficient documentation

## 2016-06-28 MED ORDER — SULFAMETHOXAZOLE-TRIMETHOPRIM 800-160 MG PO TABS: 1.0000 | ORAL_TABLET | Freq: Two times a day (BID) | ORAL | 0 refills | Status: AC

## 2016-06-28 MED ORDER — LIDOCAINE HCL (PF) 1 % IJ SOLN
5.0000 mL | Freq: Once | INTRAMUSCULAR | Status: AC
  Administered 2016-06-28: 5 mL
  Filled 2016-06-28: qty 5

## 2016-06-28 MED ORDER — HYDROCODONE-ACETAMINOPHEN 5-325 MG PO TABS: 1.0000 | ORAL_TABLET | ORAL | 0 refills | Status: DC | PRN
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
## 2016-06-28 NOTE — ED Provider Notes (Signed)
[facility] Provider Note   CSN: [id number] Arrival date & time: 06/28/16  1636   By signing my name below, I, Valentino Saxon, attest that this documentation has been prepared under the direction and in the presence of Burgess Amor, PA-C Electronically Signed: Valentino Saxon, ED Scribe. 06/28/16. 12:03 AM.  History   Chief Complaint Chief Complaint  Patient presents with  . Rectal Pain   The history is provided by the patient and a relative. No language interpreter was used.   HPI Comments: Phyllis Daniels is a 34 y.o. female who presents to the Emergency Department complaining of moderate, constant, rectal pain onset two days ago. Pt notes she went to use the bathroom and began to have sharp pains to the affected area. Pt also reports she was having a burning sensation. She states her rectal pain is exacerbated when pressure is being applied to affected area.  Per pt's daughter, the area seems to have an abrasion like a papercut more to the left side of the rectum. Pt notes the affected area is tender and sore which is causing her an excruciating pain. Pt denies recent trauma to the affected area or falls. Pt notes using baby powder and neosporin for minimal relief. Pt denies drainage or fever. No additional complaints at this time. Pt notes her PCP is Dr.Fanta.    Past Medical History:  Diagnosis Date  . Back pain     Patient Active Problem List   Diagnosis Date Noted  . Back pain at L4-L5 level 10/10/2015  . DDD (degenerative disc disease), lumbosacral 10/10/2015  . Radiculopathy of lumbosacral region 10/10/2015    Past Surgical History:  Procedure Laterality Date  . CESAREAN SECTION    . TUBAL LIGATION      OB History    Gravida Para Term Preterm AB Living   4 4 4     4    SAB TAB Ectopic Multiple Live Births                   Home Medications    Prior to Admission medications   Medication Sig Start Date End Date Taking? Authorizing Provider  diclofenac  (VOLTAREN) 75 MG EC tablet Take 1 tablet (75 mg total) by mouth 2 (two) times daily. 03/26/16   Burgess Amor, PA-C  HYDROcodone-acetaminophen (NORCO/VICODIN) 5-325 MG tablet Take 1 tablet by mouth every 4 (four) hours as needed. 06/28/16   Burgess Amor, PA-C  naproxen (NAPROSYN) 500 MG tablet Take 1 tablet (500 mg total) by mouth 2 (two) times daily. Patient not taking: Reported on 03/26/2016 01/22/16   Renne Crigler, PA-C  sulfamethoxazole-trimethoprim (BACTRIM DS,SEPTRA DS) 800-160 MG tablet Take 1 tablet by mouth 2 (two) times daily. 06/28/16 07/05/16  Burgess Amor, PA-C    Family History Family History  Problem Relation Age of Onset  . Cancer Mother   . Hypertension Mother     Social History Social History  Substance Use Topics  . Smoking status: Current Every Day Smoker    Packs/day: 0.03    Types: Cigarettes  . Smokeless tobacco: Never Used  . Alcohol use No     Allergies   Tuberculin tests and Latex   Review of Systems Review of Systems  Constitutional: Negative for fever.  HENT: Negative.   Eyes: Negative.   Respiratory: Negative.   Cardiovascular: Negative.   Gastrointestinal: Positive for rectal pain. Negative for abdominal pain and nausea.  Genitourinary: Negative.   Musculoskeletal: Negative for arthralgias, joint swelling and neck  pain.  Skin: Negative.  Negative for color change, rash and wound.  Neurological: Negative for weakness and light-headedness.  Psychiatric/Behavioral: Negative.      Physical Exam Updated Vital Signs BP 164/94 (BP Location: Left Arm)   Pulse 104   Temp 97.9 F (36.6 C) (Temporal)   Resp 20   Ht 6' (1.829 m)   Wt (!) 165.1 kg   LMP 06/25/2016   SpO2 100%   BMI 49.37 kg/m   Physical Exam  Constitutional: She appears well-developed and well-nourished.  HENT:  Head: Normocephalic and atraumatic.  Eyes: Conjunctivae are normal. Right eye exhibits no discharge. Left eye exhibits no discharge.  Pulmonary/Chest: Effort normal. No  respiratory distress.  Genitourinary:  Genitourinary Comments: 2 cm area of tender induration at her  upper midline buttocks, coccygeal area, small area of fluctuance next to the area of induration.  No skin lesions. No erythema. No drainage.    Neurological: She is alert. Coordination normal.  Skin: Skin is warm and dry. No rash noted. She is not diaphoretic. No erythema.  Psychiatric: She has a normal mood and affect.  Nursing note and vitals reviewed.    ED Treatments / Results   DIAGNOSTIC STUDIES: Oxygen Saturation is 100% on RA, normal by my interpretation.    COORDINATION OF CARE: 6:40 PM Discussed treatment plan with pt at bedside which includes Xylocaine and pt agreed to plan.   Labs (all labs ordered are listed, but only abnormal results are displayed) Labs Reviewed - No data to display  EKG  EKG Interpretation None       Radiology No results found.  Procedures Procedures (including critical care time)  INCISION AND DRAINAGE Performed by: Burgess AmorIDOL, Bedie Dominey Consent: Verbal consent obtained. Risks and benefits: risks, benefits and alternatives were discussed Type: abscess  Body area: pilonidal space  Anesthesia: local infiltration  Incision was made with a scalpel.  Local anesthetic: lidocaine 1% without epinephrine  Anesthetic total: 5 ml  Complexity: complex Blunt dissection to break up loculations  Drainage: purulent, bloody  Drainage amount: moderate  Packing material: none  Patient tolerance: Patient tolerated the procedure well with no immediate complications.    Medications Ordered in ED Medications  lidocaine (PF) (XYLOCAINE) 1 % injection 5 mL (5 mLs Other Given by Other 06/28/16 1903)     Initial Impression / Assessment and Plan / ED Course  I have reviewed the triage vital signs and the nursing notes.  Pertinent labs & imaging results that were available during my care of the patient were reviewed by me and considered in my  medical decision making (see chart for details).  Clinical Course     Bactrim, hydrocodone, warm soaks, prn f/u with pcp or here for any worsened sx.   Final Clinical Impressions(s) / ED Diagnoses   Final diagnoses:  Pilonidal abscess    New Prescriptions Discharge Medication List as of 06/28/2016  7:52 PM    START taking these medications   Details  HYDROcodone-acetaminophen (NORCO/VICODIN) 5-325 MG tablet Take 1 tablet by mouth every 4 (four) hours as needed., Starting Sun 06/28/2016, Print    sulfamethoxazole-trimethoprim (BACTRIM DS,SEPTRA DS) 800-160 MG tablet Take 1 tablet by mouth 2 (two) times daily., Starting Sun 06/28/2016, Until Sun 07/05/2016, Print        I personally performed the services described in this documentation, which was scribed in my presence. The recorded information has been reviewed and is accurate.    Burgess AmorJulie Saanvi Hakala, PA-C 06/29/16 Ivor [id number]    Lorin PicketScott  Criss AlvineGoldston, MD 07/06/16 [phone]

## 2016-06-28 NOTE — Discharge Instructions (Signed)
Take your entire course of antibiotics.  Do a warm water tub soak for 15 minutes 2-3 times daily until the site has healed.  You may take the hydrocodone prescribed for pain relief.  This will make you drowsy - do not drive within 4 hours of taking this medication.

## 2016-12-14 ENCOUNTER — Other Ambulatory Visit (HOSPITAL_COMMUNITY): Payer: Self-pay | Admitting: Internal Medicine

## 2016-12-14 ENCOUNTER — Ambulatory Visit (HOSPITAL_COMMUNITY)
Admission: RE | Admit: 2016-12-14 | Discharge: 2016-12-14 | Disposition: A | Discharge status: Home / Self Care | Payer: BLUE CROSS/BLUE SHIELD | Source: Ambulatory Visit | Attending: Internal Medicine | Admitting: Internal Medicine

## 2016-12-14 DIAGNOSIS — F53 Puerperal psychosis: Secondary | ICD-10-CM | POA: Diagnosis not present

## 2016-12-14 DIAGNOSIS — R7611 Nonspecific reaction to tuberculin skin test without active tuberculosis: Secondary | ICD-10-CM

## 2017-01-31 ENCOUNTER — Encounter (HOSPITAL_COMMUNITY): Payer: Self-pay | Admitting: Emergency Medicine

## 2017-01-31 ENCOUNTER — Emergency Department (HOSPITAL_COMMUNITY)
Admission: EM | Admit: 2017-01-31 | Discharge: 2017-01-31 | Disposition: A | Discharge status: Home / Self Care | Payer: BLUE CROSS/BLUE SHIELD | Attending: Emergency Medicine | Admitting: Emergency Medicine

## 2017-01-31 DIAGNOSIS — Z79899 Other long term (current) drug therapy: Secondary | ICD-10-CM | POA: Diagnosis not present

## 2017-01-31 DIAGNOSIS — F17210 Nicotine dependence, cigarettes, uncomplicated: Secondary | ICD-10-CM | POA: Insufficient documentation

## 2017-01-31 DIAGNOSIS — M545 Low back pain: Secondary | ICD-10-CM | POA: Diagnosis present

## 2017-01-31 DIAGNOSIS — G8929 Other chronic pain: Secondary | ICD-10-CM

## 2017-01-31 DIAGNOSIS — M5442 Lumbago with sciatica, left side: Secondary | ICD-10-CM | POA: Diagnosis not present

## 2017-01-31 DIAGNOSIS — Z91040 Latex allergy status: Secondary | ICD-10-CM | POA: Insufficient documentation

## 2017-01-31 MED ORDER — KETOROLAC TROMETHAMINE 60 MG/2ML IM SOLN
60.0000 mg | Freq: Once | INTRAMUSCULAR | Status: AC
  Administered 2017-01-31: 60 mg via INTRAMUSCULAR
  Filled 2017-01-31: qty 2

## 2017-01-31 MED ORDER — HYDROMORPHONE HCL 1 MG/ML IJ SOLN
1.0000 mg | Freq: Once | INTRAMUSCULAR | Status: AC
  Administered 2017-01-31: 1 mg via INTRAMUSCULAR
  Filled 2017-01-31: qty 1

## 2017-01-31 NOTE — ED Triage Notes (Signed)
Pt reports chronic back pain from a work injury in 2006.  States from time to time it is worse than usual.  Low back pain radiating down left leg unrelieved by pain management measures (Percocet).  States she has to come get shots for pain relief.  Denies current injury or new s/s.

## 2017-01-31 NOTE — ED Provider Notes (Signed)
[facility] Provider Note   CSN: [id number] Arrival date & time: 01/31/17  1157     History   Chief Complaint Chief Complaint  Patient presents with  . Back Pain    HPI Phyllis Daniels is a 36 y.o. female presenting with acute on chronic low back pain which has which has been present for the past 2 days.   Patient denies any new injury specifically.  There is radiation of pain into the left lower extremity.  There has been no weakness or numbness in the lower extremities and no urinary or bowel retention or incontinence.  Patient does not have a history of cancer or IVDU.  She is followed by chronic pain management, Dr. [place]ustin [place] and takes percocet The patient takes percocet 10/325 tid chronically which is not relieving her pain.  HPI  Past Medical History:  Diagnosis Date  . Back pain     Patient Active Problem List   Diagnosis Date Noted  . Back pain at L4-L5 level 10/10/2015  . DDD (degenerative disc disease), lumbosacral 10/10/2015  . Radiculopathy of lumbosacral region 10/10/2015    Past Surgical History:  Procedure Laterality Date  . CESAREAN SECTION    . TUBAL LIGATION      OB History    Gravida Para Term Preterm AB Living   4 4 4     4    SAB TAB Ectopic Multiple Live Births                   Home Medications    Prior to Admission medications   Medication Sig Start Date End Date Taking? Authorizing Provider  cyclobenzaprine (FLEXERIL) 5 MG tablet Take 1 tablet by mouth daily. 01/27/17  Yes [provider]  gabapentin (NEURONTIN) 300 MG capsule Take 1 capsule by mouth at bedtime. 12/30/16  Yes [provider]  oxyCODONE-acetaminophen (PERCOCET) 10-325 MG tablet Take 1 tablet by mouth 3 (three) times daily. 01/27/17  Yes [provider]    Family History Family History  Problem Relation Age of Onset  . Cancer Mother   . Hypertension Mother     Social History Social History  Substance Use Topics  . Smoking  status: Current Every Day Smoker    Packs/day: 0.03    Types: Cigarettes  . Smokeless tobacco: Never Used  . Alcohol use No     Allergies   Tuberculin tests and Latex   Review of Systems Review of Systems  Constitutional: Negative for fever.  Respiratory: Negative for shortness of breath.   Cardiovascular: Negative for chest pain and leg swelling.  Gastrointestinal: Negative for abdominal distention, abdominal pain and constipation.  Genitourinary: Negative for difficulty urinating, dysuria, flank pain, frequency and urgency.  Musculoskeletal: Positive for back pain. Negative for gait problem and joint swelling.  Skin: Negative for rash.  Neurological: Negative for weakness and numbness.     Physical Exam Updated Vital Signs BP (!) 162/89 (BP Location: Right Wrist)   Pulse 97   Temp 97.8 F (36.6 C) (Oral)   Resp 16   Ht 6\' 1"  (1.854 m)   Wt (!) 174.2 kg (384 lb)   LMP 01/10/2017   SpO2 98%   BMI 50.66 kg/m   Physical Exam  Constitutional: She appears well-developed and well-nourished.  HENT:  Head: Normocephalic.  Eyes: Conjunctivae are normal.  Neck: Normal range of motion. Neck supple.  Cardiovascular: Normal rate and intact distal pulses.   Pedal pulses normal.  Pulmonary/Chest: Effort normal.  Abdominal: Soft. Bowel sounds are normal. She exhibits no distension and no mass.  Musculoskeletal: Normal range of motion. She exhibits no edema.       Lumbar back: She exhibits tenderness. She exhibits no swelling, no edema and no spasm.  Neurological: She is alert. She has normal strength. She displays no atrophy and no tremor. No sensory deficit. Gait normal.  Reflex Scores:      Patellar reflexes are 2+ on the right side and 2+ on the left side. No strength deficit noted in hip and knee flexor and extensor muscle groups.  Ankle flexion and extension intact. Gait normal.  Skin: Skin is warm and dry.  Psychiatric: She has a normal mood and affect.  Nursing note  and vitals reviewed.    ED Treatments / Results  Labs (all labs ordered are listed, but only abnormal results are displayed) Labs Reviewed - No data to display  EKG  EKG Interpretation None       Radiology No results found.  Procedures Procedures (including critical care time)  Medications Ordered in ED Medications  ketorolac (TORADOL) injection 60 mg (60 mg Intramuscular Given 01/31/17 1601)  HYDROmorphone (DILAUDID) injection 1 mg (1 mg Intramuscular Given 01/31/17 1601)     Initial Impression / Assessment and Plan / ED Course  I have reviewed the triage vital signs and the nursing notes.  Pertinent labs & imaging results that were available during my care of the patient were reviewed by me and considered in my medical decision making (see chart for details).   Pt was given toradol, dilaudid injection with improvement in pain. No neuro deficit on exam or by history to suggest emergent or surgical presentation.  Discussed worsened sx that should prompt immediate re-evaluation including distal weakness, bowel/bladder retention/incontinence.  Advised further management per her pain specialist.  Chronic pain specialist Dr. Agustin Cree in [place] - last prescription 627/18 - #90 oxycodone 10/325 per database.  Final Clinical Impressions(s) / ED Diagnoses   Final diagnoses:  Chronic left-sided low back pain with left-sided sciatica    New Prescriptions New Prescriptions   No medications on file     Phyllis Daniels 02/01/17 1610    Phyllis Jester, DO 02/07/17 1106

## 2017-01-31 NOTE — ED Notes (Signed)
Hx of back pain, denies any injury.

## 2017-01-31 NOTE — Discharge Instructions (Signed)
T Avoid lifting,  Bending,  Twisting or any other activity that worsens your pain over the next week.    You should get rechecked if your symptoms are not better over the next 5 days,  Or you develop increased pain,  Weakness in your leg(s) or loss of bladder or bowel function - these are symptoms of a worse injury.

## 2017-06-18 IMAGING — DX DG CHEST 2V
2 series · 2 of 2 positions shown · non-contrast
Comparison: Radiograph dated 12/25/2014

CLINICAL DATA: 34-year-old female with productive cough

EXAM:
CHEST  2 VIEW

[chest pa]
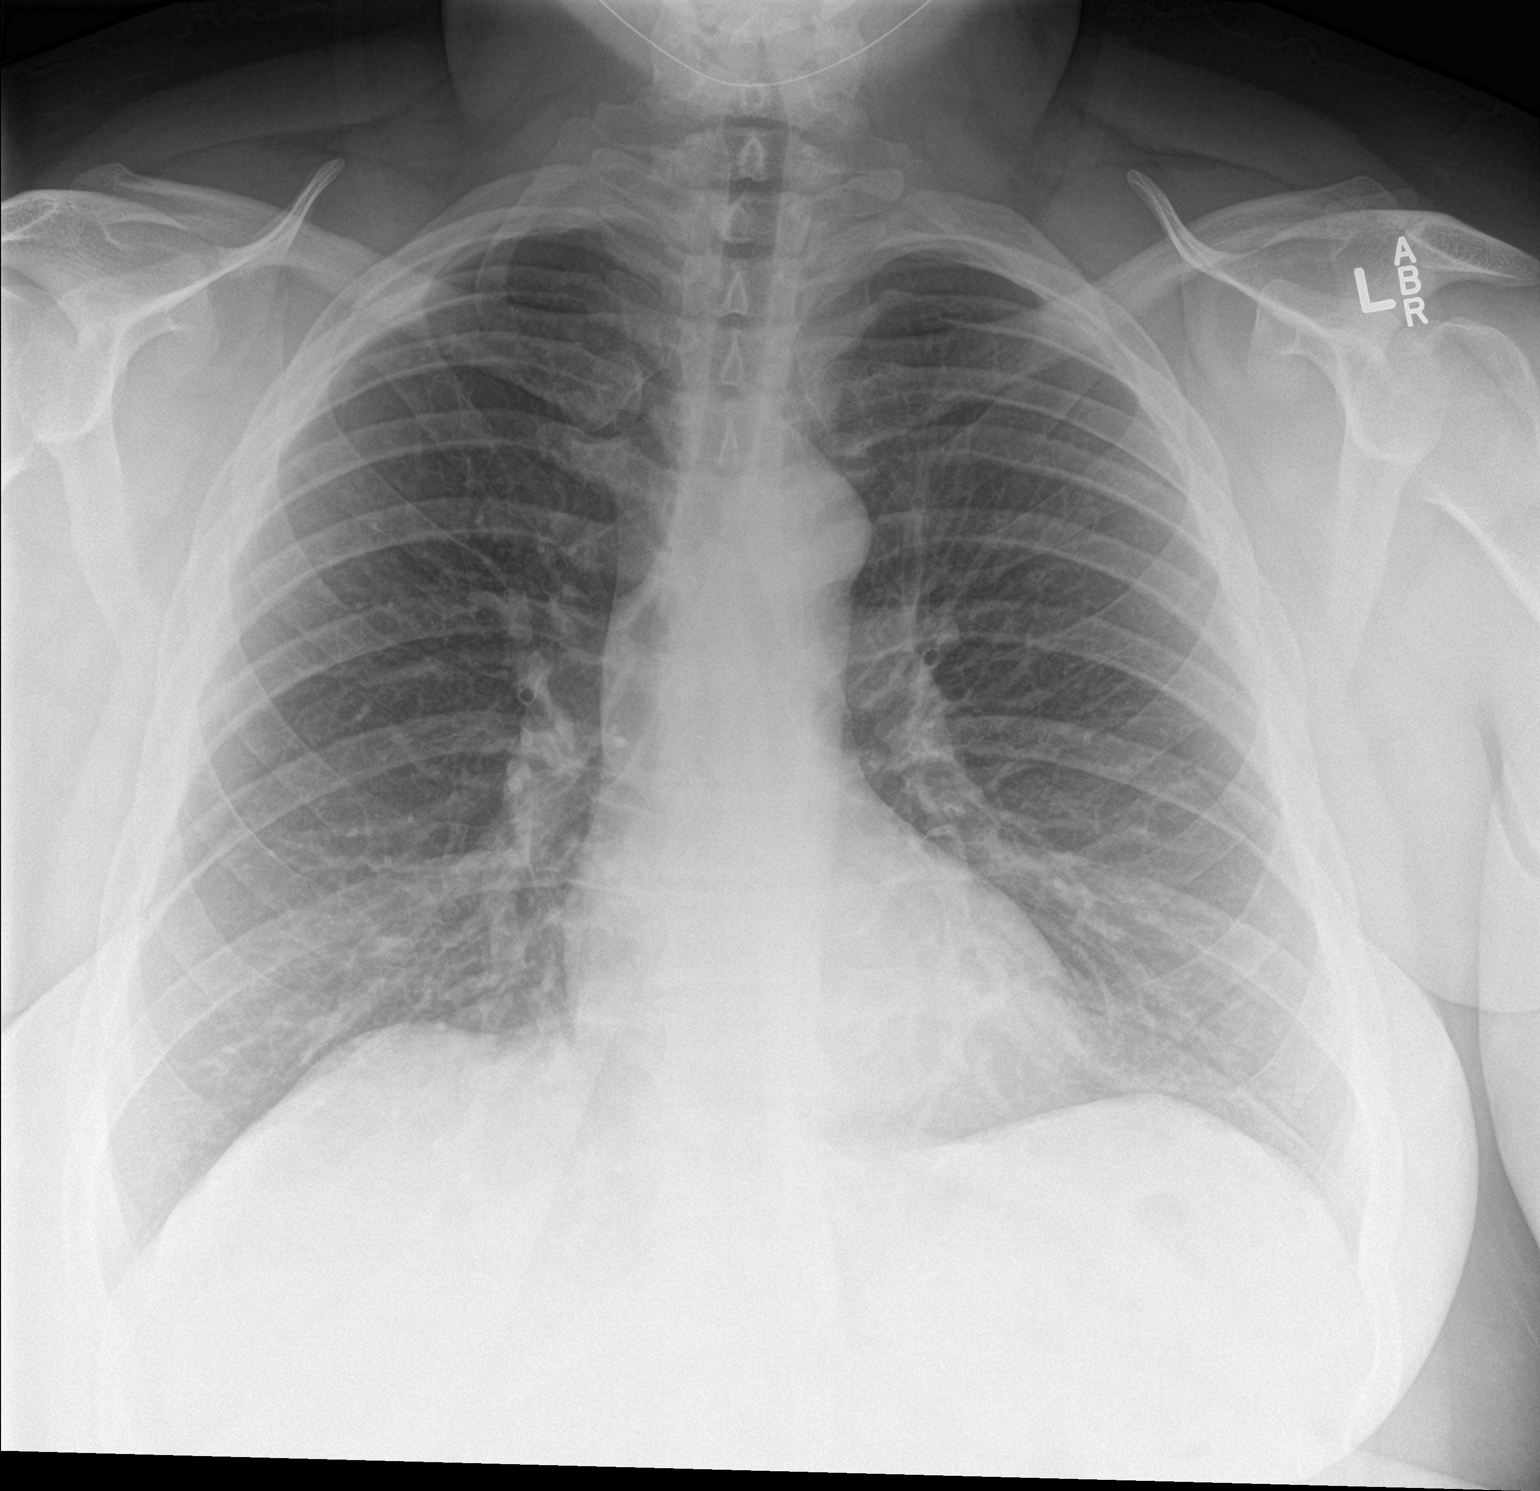

[chest lat]
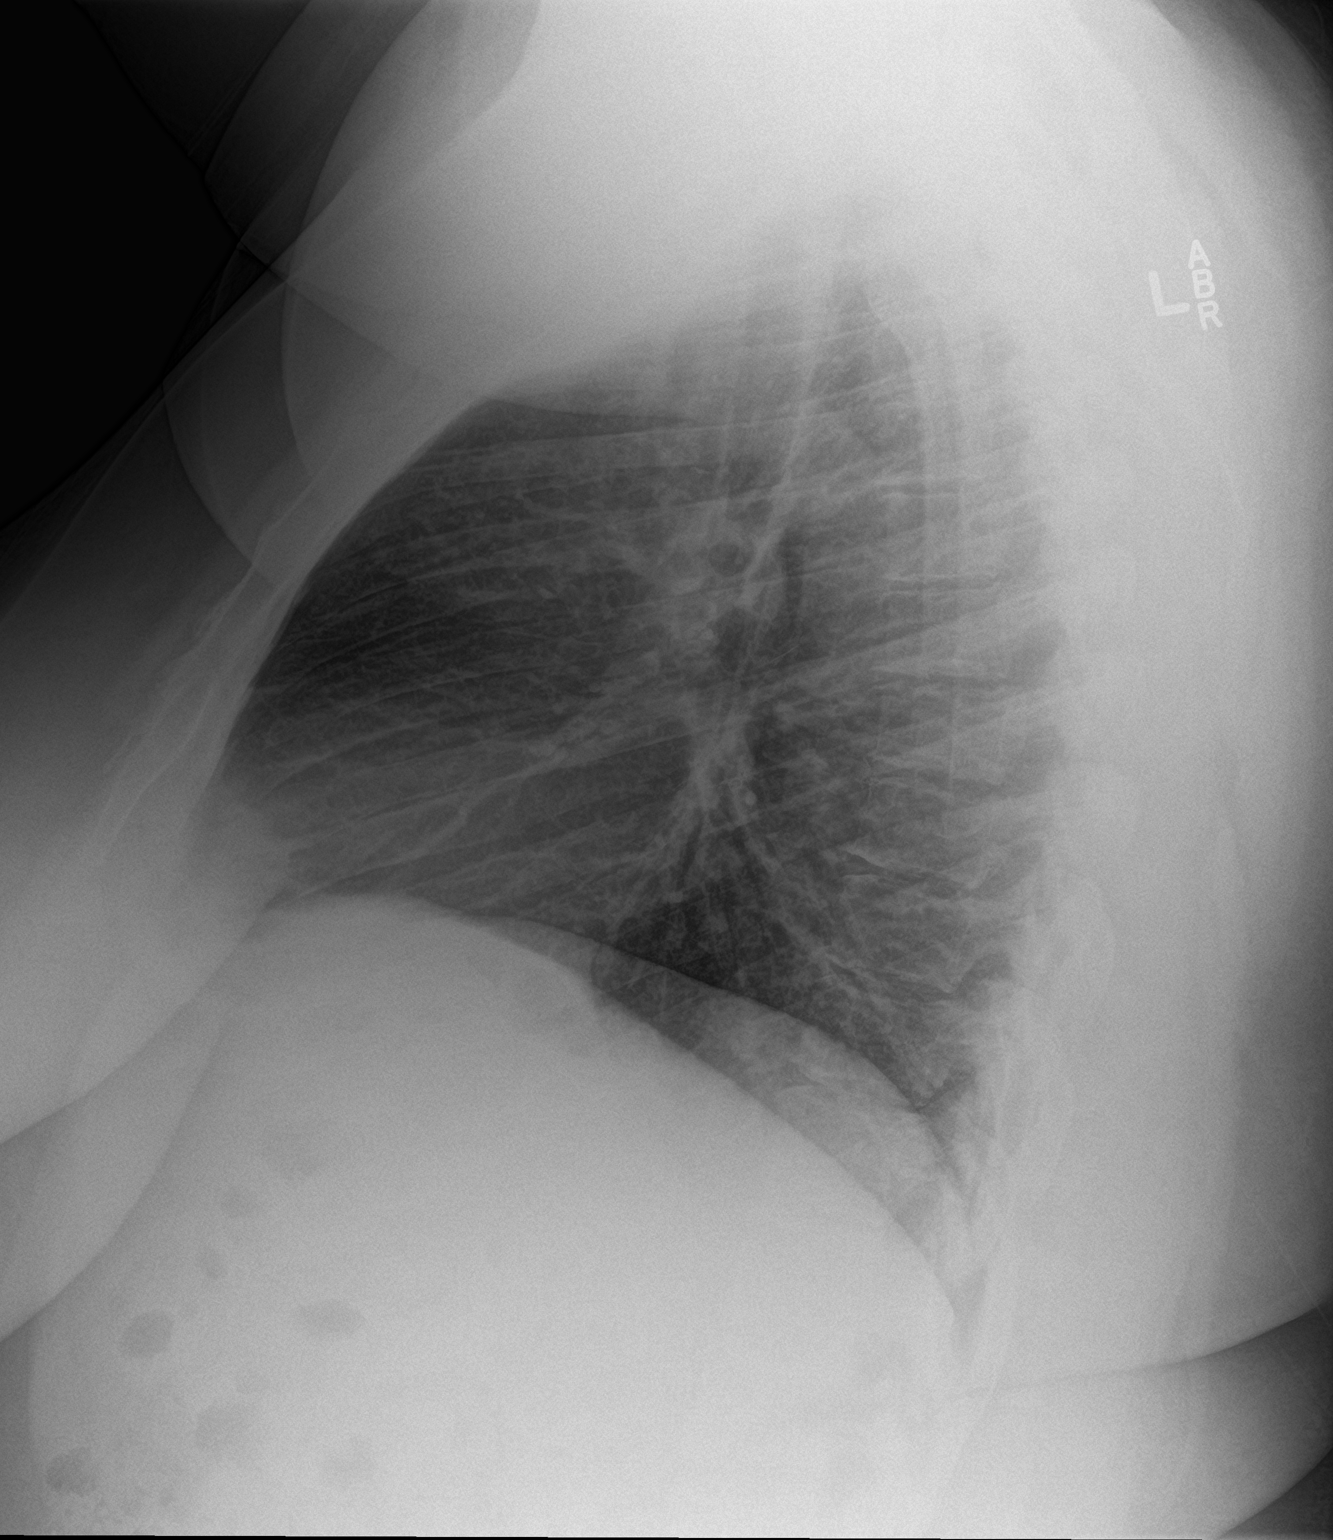

[2 of 2 positions shown; findings below may reference images not displayed]

FINDINGS: The heart size and mediastinal contours are within normal limits.
Both lungs are clear. The visualized skeletal structures are
unremarkable.
IMPRESSION: No active cardiopulmonary disease.

## 2018-04-08 ENCOUNTER — Emergency Department (HOSPITAL_COMMUNITY)
Admission: EM | Admit: 2018-04-08 | Discharge: 2018-04-08 | Disposition: A | Discharge status: Home / Self Care | Payer: BLUE CROSS/BLUE SHIELD | Attending: Emergency Medicine | Admitting: Emergency Medicine

## 2018-04-08 ENCOUNTER — Other Ambulatory Visit: Payer: Self-pay

## 2018-04-08 ENCOUNTER — Encounter (HOSPITAL_COMMUNITY): Payer: Self-pay

## 2018-04-08 DIAGNOSIS — J029 Acute pharyngitis, unspecified: Secondary | ICD-10-CM

## 2018-04-08 MED ORDER — AMOXICILLIN 500 MG PO CAPS: 500.0000 mg | ORAL_CAPSULE | Freq: Three times a day (TID) | ORAL | 0 refills | Status: DC

## 2018-04-08 MED ORDER — MAGIC MOUTHWASH W/LIDOCAINE: 5.0000 mL | Freq: Three times a day (TID) | ORAL | 0 refills | Status: DC | PRN

## 2018-04-08 NOTE — ED Provider Notes (Signed)
[facility] EMERGENCY DEPARTMENT Provider Note   CSN: [id number] Arrival date & time: 04/08/18  1104     History   Chief Complaint Chief Complaint  Patient presents with  . Sore Throat    HPI Phyllis Daniels is a 37 y.o. female.  HPI   Phyllis Daniels is a 37 y.o. female who presents to the Emergency Department complaining of sore throat and bilateral ear pain.  Symptoms present for one day.  She describes having pain to the back of her throat that is associated with swallowing.  This morning, she states she noticed blisters to the back of her throat.  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  She states she has been unable to eat today due to pain with swallowing.  She denies fever, cough, shortness of breath, chest pain, and rash.   Past Medical History:  Diagnosis Date  . Back pain     Patient Active Problem List   Diagnosis Date Noted  . Back pain at L4-L5 level 10/10/2015  . DDD (degenerative disc disease), lumbosacral 10/10/2015  . Radiculopathy of lumbosacral region 10/10/2015    Past Surgical History:  Procedure Laterality Date  . CESAREAN SECTION    . TUBAL LIGATION       OB History    Gravida  4   Para  4   Term  4   Preterm      AB      Living  4     SAB      TAB      Ectopic      Multiple      Live Births               Home Medications    Prior to Admission medications   Medication Sig Start Date End Date Taking? Authorizing Provider  amoxicillin (AMOXIL) 500 MG capsule Take 1 capsule (500 mg total) by mouth 3 (three) times daily. 04/08/18   Agron Swiney, PA-C  cyclobenzaprine (FLEXERIL) 5 MG tablet Take 1 tablet by mouth daily. 01/27/17   [provider]  gabapentin (NEURONTIN) 300 MG capsule Take 1 capsule by mouth at bedtime. 12/30/16   [provider]  magic mouthwash w/lidocaine SOLN Take 5 mLs by mouth 3 (three) times daily as needed for mouth pain. Swish and spit, do  not swallow 04/08/18   Nashayla Telleria, PA-C  oxyCODONE-acetaminophen (PERCOCET) 10-325 MG tablet Take 1 tablet by mouth 3 (three) times daily. 01/27/17   [provider]    Family History Family History  Problem Relation Age of Onset  . Cancer Mother   . Hypertension Mother     Social History Social History   Tobacco Use  . Smoking status: Current Every Day Smoker    Packs/day: 0.03    Types: Cigarettes  . Smokeless tobacco: Never Used  Substance Use Topics  . Alcohol use: No  . Drug use: No     Allergies   Tuberculin tests and Latex   Review of Systems Review of Systems  Constitutional: Positive for appetite change. Negative for activity change, chills and fever.  HENT: Positive for congestion, ear pain and sore throat. Negative for facial swelling, trouble swallowing and voice change.   Eyes: Negative for pain and visual disturbance.  Respiratory: Negative for cough, chest tightness and shortness of breath.   Cardiovascular: Negative for chest pain.  Gastrointestinal: Negative for abdominal pain, nausea and vomiting.  Musculoskeletal: Negative for  arthralgias, neck pain and neck stiffness.  Skin: Negative for color change and rash.  Neurological: Negative for dizziness, facial asymmetry, speech difficulty, numbness and headaches.  Hematological: Negative for adenopathy.     Physical Exam Updated Vital Signs BP (!) 145/98 (BP Location: Right Wrist)   Pulse 94   Temp 98.1 F (36.7 C) (Temporal)   Resp 16   Ht 6\' 1"  (1.854 m)   Wt (!) 173.3 kg   LMP 02/27/2018 (Exact Date)   SpO2 99%   BMI 50.40 kg/m   Physical Exam  Constitutional: She appears well-developed and well-nourished. No distress.  HENT:  Head: Normocephalic and atraumatic.  Right Ear: Ear canal normal. Tympanic membrane is erythematous.  Left Ear: Tympanic membrane and ear canal normal.  Mouth/Throat: Uvula is midline and mucous membranes are normal. No trismus in the jaw. No uvula  swelling. Oropharyngeal exudate and posterior oropharyngeal erythema present. No posterior oropharyngeal edema or tonsillar abscesses. Tonsillar exudate.  Exudate present of the oropharynx and bilateral tonsils.  No significant edema.  Uvula is midline and nonedematous.  No bulging of the soft tissue.  Neck: Normal range of motion. Neck supple.  Cardiovascular: Normal rate, regular rhythm and normal heart sounds.  Pulmonary/Chest: Effort normal and breath sounds normal.  Abdominal: There is no splenomegaly. There is no tenderness.  Musculoskeletal: Normal range of motion.  Lymphadenopathy:    She has no cervical adenopathy.  Neurological: She is alert. She exhibits normal muscle tone.  Skin: Skin is warm and dry.  Nursing note and vitals reviewed.    ED Treatments / Results  Labs (all labs ordered are listed, but only abnormal results are displayed) Labs Reviewed - No data to display  EKG None  Radiology No results found.  Procedures Procedures (including critical care time)  Medications Ordered in ED Medications - No data to display   Initial Impression / Assessment and Plan / ED Course  I have reviewed the triage vital signs and the nursing notes.  Pertinent labs & imaging results that were available during my care of the patient were reviewed by me and considered in my medical decision making (see chart for details).     Patient well-appearing.  Nontoxic.  Airway is patent.  No concerning symptoms for peritonsillar or retropharyngeal abscess.  Will treat with Amoxil and Magic mouthwash.  Patient agrees to care plan and appears appropriate for discharge home.  Return precautions discussed.  Final Clinical Impressions(s) / ED Diagnoses   Final diagnoses:  Acute pharyngitis, unspecified etiology    ED Discharge Orders         Ordered    amoxicillin (AMOXIL) 500 MG capsule  3 times daily     04/08/18 1156    magic mouthwash w/lidocaine SOLN  3 times daily PRN      04/08/18 1156           Zion Ta, Dustin, PA-C 04/08/18 1541    Samuel Jester, DO 04/12/18 [phone]

## 2018-04-08 NOTE — Discharge Instructions (Addendum)
Drink plenty of fluids.  Tylenol or ibuprofen every 4-6 hrs as needed for fever or pain.  Take the antibiotic as directed until its finished.  Follow-up with your doctor if needed

## 2018-04-08 NOTE — ED Notes (Signed)
Pt complains of ST x 2 days  Has taken no OTC meds for comfort or relief Sees Dr Felecia Shelling for care  Throat reddened and swollen

## 2018-04-08 NOTE — ED Triage Notes (Signed)
Pt c/o sore throat and bilateral earache since yesterday.  Denies fever.

## 2018-05-22 ENCOUNTER — Emergency Department (HOSPITAL_COMMUNITY)
Admission: EM | Admit: 2018-05-22 | Discharge: 2018-05-22 | Disposition: A | Discharge status: Home / Self Care | Payer: BLUE CROSS/BLUE SHIELD | Attending: Emergency Medicine | Admitting: Emergency Medicine

## 2018-05-22 ENCOUNTER — Encounter (HOSPITAL_COMMUNITY): Payer: Self-pay | Admitting: Emergency Medicine

## 2018-05-22 ENCOUNTER — Other Ambulatory Visit: Payer: Self-pay

## 2018-05-22 DIAGNOSIS — F17210 Nicotine dependence, cigarettes, uncomplicated: Secondary | ICD-10-CM | POA: Insufficient documentation

## 2018-05-22 DIAGNOSIS — K0889 Other specified disorders of teeth and supporting structures: Secondary | ICD-10-CM

## 2018-05-22 DIAGNOSIS — Z79899 Other long term (current) drug therapy: Secondary | ICD-10-CM | POA: Insufficient documentation

## 2018-05-22 DIAGNOSIS — K029 Dental caries, unspecified: Secondary | ICD-10-CM

## 2018-05-22 MED ORDER — TRAMADOL HCL 50 MG PO TABS: ORAL_TABLET | ORAL | 0 refills | Status: DC

## 2018-05-22 MED ORDER — IBUPROFEN 800 MG PO TABS
800.0000 mg | ORAL_TABLET | Freq: Once | ORAL | Status: AC
  Administered 2018-05-22: 800 mg via ORAL
  Filled 2018-05-22: qty 1

## 2018-05-22 MED ORDER — AMOXICILLIN 500 MG PO CAPS: 500.0000 mg | ORAL_CAPSULE | Freq: Three times a day (TID) | ORAL | 0 refills | Status: DC

## 2018-05-22 MED ORDER — ACETAMINOPHEN 500 MG PO TABS
1000.0000 mg | ORAL_TABLET | Freq: Once | ORAL | Status: AC
  Administered 2018-05-22: 1000 mg via ORAL
  Filled 2018-05-22: qty 2

## 2018-05-22 MED ORDER — IBUPROFEN 600 MG PO TABS: 600.0000 mg | ORAL_TABLET | Freq: Four times a day (QID) | ORAL | 0 refills | Status: DC

## 2018-05-22 MED ORDER — AMOXICILLIN 250 MG PO CAPS
500.0000 mg | ORAL_CAPSULE | Freq: Once | ORAL | Status: AC
  Administered 2018-05-22: 500 mg via ORAL
  Filled 2018-05-22: qty 2

## 2018-05-22 NOTE — Discharge Instructions (Addendum)
Please use Amoxil 3 times daily.  Please use ibuprofen with breakfast, lunch, dinner, and at bedtime.  May use Ultram for more severe pain. This medication may cause drowsiness. Please do not drink, drive, or participate in activity that requires concentration while taking this medication.  It is important that you see your dentist as soon as possible for resolution of this problem.

## 2018-05-22 NOTE — ED Triage Notes (Signed)
Patient c/o left lower dental pain that started on Thursday. Denies any fevers. Per patient using Orajel with slight intermittent relief.

## 2018-05-22 NOTE — ED Provider Notes (Signed)
[facility] EMERGENCY DEPARTMENT Provider Note   CSN: [id number] Arrival date & time: 05/22/18  1201     History   Chief Complaint Chief Complaint  Patient presents with  . Dental Pain    HPI Phyllis Daniels is a 37 y.o. female.  NO temp elevation. Pain moving from jaw to left ear at times.  The history is provided by the patient.  Dental Pain   This is a new problem. The current episode started more than 2 days ago. The problem occurs daily. The problem has been gradually worsening. The pain is moderate. Treatments tried: oragel. The treatment provided no relief.    Past Medical History:  Diagnosis Date  . Back pain     Patient Active Problem List   Diagnosis Date Noted  . Back pain at L4-L5 level 10/10/2015  . DDD (degenerative disc disease), lumbosacral 10/10/2015  . Radiculopathy of lumbosacral region 10/10/2015    Past Surgical History:  Procedure Laterality Date  . CESAREAN SECTION    . TUBAL LIGATION       OB History    Gravida  4   Para  4   Term  4   Preterm      AB      Living  4     SAB      TAB      Ectopic      Multiple      Live Births               Home Medications    Prior to Admission medications   Medication Sig Start Date End Date Taking? Authorizing Provider  amoxicillin (AMOXIL) 500 MG capsule Take 1 capsule (500 mg total) by mouth 3 (three) times daily. 04/08/18   Triplett, Tammy, PA-C  cyclobenzaprine (FLEXERIL) 5 MG tablet Take 1 tablet by mouth daily. 01/27/17   [provider]  gabapentin (NEURONTIN) 300 MG capsule Take 1 capsule by mouth at bedtime. 12/30/16   [provider]  magic mouthwash w/lidocaine SOLN Take 5 mLs by mouth 3 (three) times daily as needed for mouth pain. Swish and spit, do not swallow 04/08/18   Triplett, Tammy, PA-C  oxyCODONE-acetaminophen (PERCOCET) 10-325 MG tablet Take 1 tablet by mouth 3 (three) times daily. 01/27/17   [provider]    Family History Family  History  Problem Relation Age of Onset  . Cancer Mother   . Hypertension Mother     Social History Social History   Tobacco Use  . Smoking status: Current Every Day Smoker    Packs/day: 0.03    Types: Cigarettes  . Smokeless tobacco: Never Used  Substance Use Topics  . Alcohol use: No  . Drug use: No     Allergies   Tuberculin tests and Latex   Review of Systems Review of Systems  Constitutional: Negative for activity change.       All ROS Neg except as noted in HPI  HENT: Positive for dental problem. Negative for nosebleeds.   Eyes: Negative for photophobia and discharge.  Respiratory: Negative for cough, shortness of breath and wheezing.   Cardiovascular: Negative for chest pain and palpitations.  Gastrointestinal: Negative for abdominal pain and blood in stool.  Genitourinary: Negative for dysuria, frequency and hematuria.  Musculoskeletal: Negative for arthralgias, back pain and neck pain.  Skin: Negative.   Neurological: Positive for headaches. Negative for dizziness, seizures and speech difficulty.  Psychiatric/Behavioral: Negative for confusion and hallucinations.     Physical  Exam Updated Vital Signs BP (!) 153/88 (BP Location: Right Arm)   Pulse 89   Temp 97.9 F (36.6 C) (Oral)   Resp 16   Ht 6\' 1"  (1.854 m)   Wt (!) 174.2 kg   SpO2 98%   BMI 50.66 kg/m   Physical Exam  Constitutional: She is oriented to person, place, and time. She appears well-developed and well-nourished.  Non-toxic appearance.  HENT:  Head: Normocephalic.  Right Ear: Tympanic membrane and external ear normal.  Left Ear: Tympanic membrane and external ear normal.  There is a cavity of the left posterior molar.  There is increased swelling of the gum, but no noted abscess.  The airway is patent.  There is no swelling under the tongue.  Eyes: Pupils are equal, round, and reactive to light. EOM and lids are normal.  Neck: Normal range of motion. Neck supple. Carotid bruit is not  present.  Cardiovascular: Normal rate, regular rhythm, normal heart sounds, intact distal pulses and normal pulses.  Pulmonary/Chest: Breath sounds normal. No respiratory distress.  Abdominal: Soft. Bowel sounds are normal. There is no tenderness. There is no guarding.  Musculoskeletal: Normal range of motion.  Lymphadenopathy:       Head (right side): No submandibular adenopathy present.       Head (left side): No submandibular adenopathy present.    She has no cervical adenopathy.  Neurological: She is alert and oriented to person, place, and time. She has normal strength. No cranial nerve deficit or sensory deficit.  Skin: Skin is warm and dry.  Psychiatric: She has a normal mood and affect. Her speech is normal.  Nursing note and vitals reviewed.    ED Treatments / Results  Labs (all labs ordered are listed, but only abnormal results are displayed) Labs Reviewed - No data to display  EKG None  Radiology No results found.  Procedures Procedures (including critical care time)  Medications Ordered in ED Medications - No data to display   Initial Impression / Assessment and Plan / ED Course  I have reviewed the triage vital signs and the nursing notes.  Pertinent labs & imaging results that were available during my care of the patient were reviewed by me and considered in my medical decision making (see chart for details).       Final Clinical Impressions(s) / ED Diagnoses MDM  Vital signs reviewed.  Pulse oximetry is within normal limits.  Patient has dental problems at the left lower jaw area.  She is using Orajel but this is not working.  No temperature elevations.  No trismus.  No evidence for Ludewig's angina.  No other emergent changes appreciated.  Patient strongly encouraged to see a dentist as soon as possible.  Prescription for Amoxil, ibuprofen and Ultram given to the patient.   Final diagnoses:  Dental caries  Pain, dental    ED Discharge Orders          Ordered    amoxicillin (AMOXIL) 500 MG capsule  3 times daily     05/22/18 1438    traMADol (ULTRAM) 50 MG tablet     05/22/18 1438    ibuprofen (ADVIL,MOTRIN) 600 MG tablet  4 times daily     05/22/18 1438           Ivery Quale, PA-C 05/22/18 1828    Samuel Jester, DO 05/25/18 1100

## 2018-06-24 ENCOUNTER — Emergency Department (HOSPITAL_COMMUNITY)
Admission: EM | Admit: 2018-06-24 | Discharge: 2018-06-24 | Disposition: A | Discharge status: Home / Self Care | Payer: Self-pay | Attending: Emergency Medicine | Admitting: Emergency Medicine

## 2018-06-24 ENCOUNTER — Other Ambulatory Visit: Payer: Self-pay

## 2018-06-24 ENCOUNTER — Encounter (HOSPITAL_COMMUNITY): Payer: Self-pay | Admitting: Emergency Medicine

## 2018-06-24 DIAGNOSIS — B9789 Other viral agents as the cause of diseases classified elsewhere: Secondary | ICD-10-CM

## 2018-06-24 DIAGNOSIS — J069 Acute upper respiratory infection, unspecified: Secondary | ICD-10-CM | POA: Insufficient documentation

## 2018-06-24 DIAGNOSIS — B9689 Other specified bacterial agents as the cause of diseases classified elsewhere: Secondary | ICD-10-CM | POA: Insufficient documentation

## 2018-06-24 MED ORDER — NAPROXEN 250 MG PO TABS
500.0000 mg | ORAL_TABLET | Freq: Once | ORAL | Status: AC
  Administered 2018-06-24: 500 mg via ORAL
  Filled 2018-06-24: qty 2

## 2018-06-24 MED ORDER — ALBUTEROL SULFATE HFA 108 (90 BASE) MCG/ACT IN AERS: 1.0000 | INHALATION_SPRAY | Freq: Four times a day (QID) | RESPIRATORY_TRACT | 0 refills | Status: DC | PRN

## 2018-06-24 MED ORDER — IPRATROPIUM-ALBUTEROL 0.5-2.5 (3) MG/3ML IN SOLN
3.0000 mL | Freq: Once | RESPIRATORY_TRACT | Status: AC
  Administered 2018-06-24: 3 mL via RESPIRATORY_TRACT
  Filled 2018-06-24: qty 3

## 2018-06-24 NOTE — Discharge Instructions (Signed)
You were evaluated in the Emergency Department and after careful evaluation, we did not find any emergent condition requiring admission or further testing in the hospital.  Your symptoms today seem to be due to a viral illness.  You can use the inhaler provided as needed for coughing fits.  Tylenol and ibuprofen at home for your other symptoms.  Plenty of fluids at home.  Please return to the Emergency Department if you experience any worsening of your condition.  We encourage you to follow up with a primary care provider.  Thank you for allowing us to be a part of your care.

## 2018-06-24 NOTE — ED Provider Notes (Signed)
[facility] Emergency Department Provider Note MRN:  [id number]  Arrival date & time: 06/24/18     Chief Complaint   Cough   History of Present Illness   Phyllis Daniels is a 37 y.o. year-old female with no pertinent past medical history presenting to the ED with chief complaint of cough.  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  Patient felt well yesterday, but during the evening last night began feeling general malaise, cough.  Persistent productive cough this morning.  Feeling sore in the chest due to all the coughing.  Denies shortness of breath.  Subjective fevers at home.  Review of Systems  A complete 10 system review of systems was obtained and all systems are negative except as noted in the HPI and PMH.   Patient's Health History    Past Medical History:  Diagnosis Date  . Back pain     Past Surgical History:  Procedure Laterality Date  . CESAREAN SECTION    . TUBAL LIGATION      Family History  Problem Relation Age of Onset  . Cancer Mother   . Hypertension Mother     Social History   Socioeconomic History  . Marital status: Legally Separated    Spouse name: Not on file  . Number of children: Not on file  . Years of education: Not on file  . Highest education level: Not on file  Occupational History  . Not on file  Social Needs  . Financial resource strain: Not on file  . Food insecurity:    Worry: Not on file    Inability: Not on file  . Transportation needs:    Medical: Not on file    Non-medical: Not on file  Tobacco Use  . Smoking status: Current Every Day Smoker    Packs/day: 0.03    Types: Cigarettes  . Smokeless tobacco: Never Used  Substance and Sexual Activity  . Alcohol use: No  . Drug use: No  . Sexual activity: Yes    Birth control/protection: Surgical  Lifestyle  . Physical activity:    Days per week: Not on file     Minutes per session: Not on file  . Stress: Not on file  Relationships  . Social connections:    Talks on phone: Not on file    Gets together: Not on file    Attends religious service: Not on file    Active member of club or organization: Not on file    Attends meetings of clubs or organizations: Not on file    Relationship status: Not on file  . Intimate partner violence:    Fear of current or ex partner: Not on file    Emotionally abused: Not on file    Physically abused: Not on file    Forced sexual activity: Not on file  Other Topics Concern  . Not on file  Social History Narrative  . Not on file     Physical Exam  Vital Signs and Nursing Notes reviewed Vitals:   06/24/18 0737 06/24/18 0858  BP: (!) 178/104   Pulse: (!) 101   Resp: 20   Temp: 99.4 F (37.4 C)   SpO2: 97% 98%    CONSTITUTIONAL: Well-appearing, NAD NEURO:  Alert and oriented x 3, no focal deficits EYES:  eyes equal and reactive ENT/NECK:  no LAD, no JVD CARDIO: Tachycardic rate,  well-perfused, normal S1 and S2 PULM:  CTAB no wheezing or rhonchi GI/GU:  normal bowel sounds, non-distended, non-tender MSK/SPINE:  No gross deformities, no edema SKIN:  no rash, atraumatic PSYCH:  Appropriate speech and behavior  Diagnostic and Interventional Summary    EKG Interpretation  Date/Time:  Friday June 24 2018 08:20:52 EST Ventricular Rate:  94 PR Interval:    QRS Duration: 77 QT Interval:  347 QTC Calculation: 434 R Axis:   74 Text Interpretation:  Sinus rhythm Confirmed by Kennis CarinaBero, Sophea Rackham [phone]([id number]) on 06/24/2018 9:10:11 AM      Labs Reviewed - No data to display  No orders to display    Medications  ipratropium-albuterol (DUONEB) 0.5-2.5 (3) MG/3ML nebulizer solution 3 mL (3 mLs Nebulization Given 06/24/18 0858)  naproxen (NAPROSYN) tablet 500 mg (500 mg Oral Given 06/24/18 [id number])     Procedures Critical Care  ED Course and Medical Decision Making  I have reviewed the triage vital  signs and the nursing notes.  Pertinent labs & imaging results that were available during my care of the patient were reviewed by me and considered in my medical decision making (see below for details).  Cough, scratchy throat consistent with viral illness, positive sick contacts at work.  No shortness of breath, lung sounds clear, nothing to suggest bacterial pneumonia, no indication for chest x-ray at this time.  Will provide symptomatic treatment, screen with EKG given the chest soreness that is likely related to the persistent cough.  Feeling much better after breathing treatment, heart rate improved to 85.  Favoring URI, given prescription for albuterol inhaler for coughing fits, advised Tylenol and ibuprofen and plenty of fluids at home.  Will return or see your physician if she becomes much worse within the next 1 to 2 weeks.  After the discussed management above, the patient was determined to be safe for discharge.  The patient was in agreement with this plan and all questions regarding their care were answered.  ED return precautions were discussed and the patient will return to the ED with any significant worsening of condition.  Elmer SowMichael M. Pilar PlateBero, MD [facility] [email or web address]  Final Clinical Impressions(s) / ED Diagnoses     ICD-10-CM   1. Viral URI with cough J06.9    B97.89     ED Discharge Orders         Ordered    albuterol (PROVENTIL HFA;VENTOLIN HFA) 108 (90 Base) MCG/ACT inhaler  Every 6 hours PRN     06/24/18 0912             Sabas SousBero, Naileah Karg M, MD 06/24/18 [phone] [id number]

## 2018-06-24 NOTE — ED Triage Notes (Signed)
Pt c/o productive cough with yellow sputum, chest pain with coughing, and SOB since yesterday.

## 2018-07-10 ENCOUNTER — Emergency Department (HOSPITAL_COMMUNITY)
Admission: EM | Admit: 2018-07-10 | Discharge: 2018-07-10 | Disposition: A | Discharge status: Home / Self Care | Payer: Self-pay | Attending: Emergency Medicine | Admitting: Emergency Medicine

## 2018-07-10 ENCOUNTER — Emergency Department (HOSPITAL_COMMUNITY): Payer: Self-pay

## 2018-07-10 ENCOUNTER — Other Ambulatory Visit: Payer: Self-pay

## 2018-07-10 ENCOUNTER — Encounter (HOSPITAL_COMMUNITY): Payer: Self-pay | Admitting: Emergency Medicine

## 2018-07-10 DIAGNOSIS — F17210 Nicotine dependence, cigarettes, uncomplicated: Secondary | ICD-10-CM | POA: Insufficient documentation

## 2018-07-10 DIAGNOSIS — J40 Bronchitis, not specified as acute or chronic: Secondary | ICD-10-CM | POA: Insufficient documentation

## 2018-07-10 LAB — GROUP A STREP BY PCR: Group A Strep by PCR: NOT DETECTED

## 2018-07-10 MED ORDER — DOXYCYCLINE HYCLATE 100 MG PO CAPS: 100.0000 mg | ORAL_CAPSULE | Freq: Two times a day (BID) | ORAL | 0 refills | Status: DC

## 2018-07-10 MED ORDER — PREDNISONE 10 MG PO TABS: ORAL_TABLET | ORAL | 0 refills | Status: DC

## 2018-07-10 MED ORDER — ALBUTEROL SULFATE HFA 108 (90 BASE) MCG/ACT IN AERS: 1.0000 | INHALATION_SPRAY | Freq: Four times a day (QID) | RESPIRATORY_TRACT | 1 refills | Status: DC | PRN

## 2018-07-10 NOTE — Discharge Instructions (Addendum)
Chest x-ray and strep test were negative.  Prescription for antibiotic, inhaler, prednisone.  Increase fluids.  Tylenol for fever or achiness.

## 2018-07-10 NOTE — ED Triage Notes (Signed)
Patient c/o productive cough with thick yellow sputum x2 weeks. Per patient seen here in ER 2 weeks ago and diagnosed with URI. Denies any improvement. Patient states people at work diagnosed with bronchitis, pneumonia, and 2 small children with RSV. Patient states body aches, chills, headache, and fever. Patient taking ibuprofen, tylenol, and nightquil cold and flu- last thing taken was nightquil at 10pm yesterday.  Patient also states she has had some diarrhea. Denies any nausea and vomiting.

## 2018-07-10 NOTE — ED Provider Notes (Signed)
[facility]Y [facility] Provider Note   CSN: [id number] Arrival date & time: 07/10/18  1323     History   Chief Complaint Chief Complaint  Patient presents with  . Cough    HPI Phyllis Daniels is a 37 y.o. female.  Productive cough with yellow sputum for 2 weeks with associated headache, feeling hot and cold, diarrhea.  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  She has been taking over-the-counter counter products with minimal success.  One previous emergency visit.  Severity of symptoms is mild to moderate.     Past Medical History:  Diagnosis Date  . Back pain     Patient Active Problem List   Diagnosis Date Noted  . Back pain at L4-L5 level 10/10/2015  . DDD (degenerative disc disease), lumbosacral 10/10/2015  . Radiculopathy of lumbosacral region 10/10/2015    Past Surgical History:  Procedure Laterality Date  . CESAREAN SECTION    . TUBAL LIGATION       OB History    Gravida  4   Para  4   Term  4   Preterm      AB      Living  4     SAB      TAB      Ectopic      Multiple      Live Births               Home Medications    Prior to Admission medications   Medication Sig Start Date End Date Taking? Authorizing Provider  albuterol (PROVENTIL HFA;VENTOLIN HFA) 108 (90 Base) MCG/ACT inhaler Inhale 1-2 puffs into the lungs every 6 (six) hours as needed (cough). 06/24/18  Yes Sabas SousBero, Michael M, MD  ibuprofen (ADVIL,MOTRIN) 600 MG tablet Take 1 tablet (600 mg total) by mouth 4 (four) times daily. Patient taking differently: Take 400 mg by mouth 4 (four) times daily.  05/22/18  Yes Ivery QualeBryant, Hobson, PA-C  IUD'S IU by Intrauterine route once.   Yes [provider]  albuterol (PROVENTIL HFA;VENTOLIN HFA) 108 (90 Base) MCG/ACT inhaler Inhale 1-2 puffs into the lungs every 6 (six) hours as needed for wheezing or shortness of breath. 07/10/18   Donnetta Hutchingook, Destinae Neubecker, MD    doxycycline (VIBRAMYCIN) 100 MG capsule Take 1 capsule (100 mg total) by mouth 2 (two) times daily. 07/10/18   Donnetta Hutchingook, Decklin Weddington, MD  magic mouthwash w/lidocaine SOLN Take 5 mLs by mouth 3 (three) times daily as needed for mouth pain. Swish and spit, do not swallow Patient not taking: Reported on 07/10/2018 04/08/18   Triplett, Tammy, PA-C  predniSONE (DELTASONE) 10 MG tablet 3 tabs for 3 days, 2 tabs for 3 days, 1 tab for 3 days. 07/10/18   Donnetta Hutchingook, Aimie Wagman, MD  traMADol Janean Sark(ULTRAM) 50 MG tablet 1 or 2 po q6h prn pain Patient not taking: Reported on 07/10/2018 05/22/18   Ivery QualeBryant, Hobson, PA-C    Family History Family History  Problem Relation Age of Onset  . Cancer Mother   . Hypertension Mother     Social History Social History   Tobacco Use  . Smoking status: Current Every Day Smoker    Packs/day: 0.03    Types: Cigarettes  . Smokeless tobacco: Never Used  Substance Use Topics  . Alcohol use: No  . Drug use: No     Allergies   Tuberculin tests and Latex   Review of Systems  Review of Systems  All other systems reviewed and are negative.    Physical Exam Updated Vital Signs BP (!) 135/101 (BP Location: Right Arm)   Pulse 85   Temp 98 F (36.7 C) (Oral)   Resp 18   Ht 6' 1.5" (1.867 m)   Wt (!) 174.2 kg   SpO2 97%   BMI 49.98 kg/m   Physical Exam  Constitutional: She is oriented to person, place, and time.  Coughing, nontoxic  HENT:  Head: Normocephalic and atraumatic.  Eyes: Conjunctivae are normal.  Neck: Neck supple.  Cardiovascular: Normal rate and regular rhythm.  Pulmonary/Chest: Effort normal and breath sounds normal.  Abdominal: Soft. Bowel sounds are normal.  Musculoskeletal: Normal range of motion.  Neurological: She is alert and oriented to person, place, and time.  Skin: Skin is warm and dry.  Psychiatric: She has a normal mood and affect. Her behavior is normal.  Nursing note and vitals reviewed.    ED Treatments / Results  Labs (all labs ordered are  listed, but only abnormal results are displayed) Labs Reviewed  GROUP A STREP BY PCR    EKG None  Radiology Dg Chest 2 View  Result Date: 07/10/2018 CLINICAL DATA:  Upper respiratory infection and cough. EXAM: CHEST - 2 VIEW COMPARISON:  12/14/2016 FINDINGS: The heart size and mediastinal contours are within normal limits. There is no evidence of pulmonary edema, consolidation, pneumothorax, nodule or pleural fluid. The visualized skeletal structures are unremarkable. IMPRESSION: No active cardiopulmonary disease. Electronically Signed   By: Irish Lack M.D.   On: 07/10/2018 14:47    Procedures Procedures (including critical care time)  Medications Ordered in ED Medications - No data to display   Initial Impression / Assessment and Plan / ED Course  I have reviewed the triage vital signs and the nursing notes.  Pertinent labs & imaging results that were available during my care of the patient were reviewed by me and considered in my medical decision making (see chart for details).    Patient presents with persistent cough and other associated symptoms.  Chest x-ray shows no obvious pneumonia.  Will prescribe antibiotics secondary to longevity of symptoms.  Discharge medications doxycycline 100 mg, prednisone, albuterol inhaler.   Final Clinical Impressions(s) / ED Diagnoses   Final diagnoses:  Bronchitis    ED Discharge Orders         Ordered    doxycycline (VIBRAMYCIN) 100 MG capsule  2 times daily     07/10/18 1516    albuterol (PROVENTIL HFA;VENTOLIN HFA) 108 (90 Base) MCG/ACT inhaler  Every 6 hours PRN     07/10/18 1516    predniSONE (DELTASONE) 10 MG tablet     07/10/18 1516           Donnetta Hutching, MD 07/10/18 1519

## 2018-08-27 ENCOUNTER — Other Ambulatory Visit: Payer: Self-pay

## 2018-08-27 ENCOUNTER — Encounter (HOSPITAL_COMMUNITY): Payer: Self-pay | Admitting: Emergency Medicine

## 2018-08-27 ENCOUNTER — Emergency Department (HOSPITAL_COMMUNITY)
Admission: EM | Admit: 2018-08-27 | Discharge: 2018-08-28 | Disposition: A | Discharge status: Home / Self Care | Payer: Medicaid Other | Attending: Emergency Medicine | Admitting: Emergency Medicine

## 2018-08-27 DIAGNOSIS — Z91040 Latex allergy status: Secondary | ICD-10-CM | POA: Insufficient documentation

## 2018-08-27 DIAGNOSIS — Z79899 Other long term (current) drug therapy: Secondary | ICD-10-CM | POA: Insufficient documentation

## 2018-08-27 DIAGNOSIS — R200 Anesthesia of skin: Secondary | ICD-10-CM | POA: Insufficient documentation

## 2018-08-27 DIAGNOSIS — F17210 Nicotine dependence, cigarettes, uncomplicated: Secondary | ICD-10-CM | POA: Insufficient documentation

## 2018-08-27 LAB — URINALYSIS, ROUTINE W REFLEX MICROSCOPIC
BILIRUBIN URINE: NEGATIVE
GLUCOSE, UA: NEGATIVE mg/dL
HGB URINE DIPSTICK: NEGATIVE
KETONES UR: NEGATIVE mg/dL
Leukocytes, UA: NEGATIVE
Nitrite: NEGATIVE
PROTEIN: NEGATIVE mg/dL
Specific Gravity, Urine: 1.016 (ref 1.005–1.030)
pH: 7 (ref 5.0–8.0)

## 2018-08-27 LAB — CBC WITH DIFFERENTIAL/PLATELET
Abs Immature Granulocytes: 0.03 10*3/uL (ref 0.00–0.07)
BASOS PCT: 1 %
Basophils Absolute: 0.1 10*3/uL (ref 0.0–0.1)
EOS ABS: 0.4 10*3/uL (ref 0.0–0.5)
Eosinophils Relative: 5 %
HCT: 43.3 % (ref 36.0–46.0)
Hemoglobin: 12.8 g/dL (ref 12.0–15.0)
Immature Granulocytes: 0 %
Lymphocytes Relative: 32 %
Lymphs Abs: 2.7 10*3/uL (ref 0.7–4.0)
MCH: 27 pg (ref 26.0–34.0)
MCHC: 29.6 g/dL — AB (ref 30.0–36.0)
MCV: 91.4 fL (ref 80.0–100.0)
MONO ABS: 0.7 10*3/uL (ref 0.1–1.0)
MONOS PCT: 8 %
Neutro Abs: 4.6 10*3/uL (ref 1.7–7.7)
Neutrophils Relative %: 54 %
Platelets: 336 10*3/uL (ref 150–400)
RBC: 4.74 MIL/uL (ref 3.87–5.11)
RDW: 14.5 % (ref 11.5–15.5)
WBC: 8.4 10*3/uL (ref 4.0–10.5)
nRBC: 0 % (ref 0.0–0.2)

## 2018-08-27 LAB — CBG MONITORING, ED: GLUCOSE-CAPILLARY: 85 mg/dL (ref 70–99)

## 2018-08-27 MED ORDER — DIAZEPAM 2 MG PO TABS
2.0000 mg | ORAL_TABLET | Freq: Once | ORAL | Status: AC
  Administered 2018-08-28: 2 mg via ORAL
  Filled 2018-08-27: qty 1

## 2018-08-27 NOTE — ED Provider Notes (Signed)
[facility] EMERGENCY DEPARTMENT Provider Note   CSN: [id number] Arrival date & time: 08/27/18  2114     History   Chief Complaint Chief Complaint  Patient presents with  . Numbness    HPI Phyllis Daniels is a 38 y.o. female.  38 y.o female with no PMH presents to the ED with a chief complaint of numbness to the right side of her face. Patient reports she began experiencing a "numbness and tingling" about an hour ago prior to arrival. She describes this tingling sensation along her right eye, right sinus region and right jaw. Patient denies any alleviating or exacerbating factors. She has not tried any medical therapy for relieve in symptoms. She denies any headache, lightheaded, chest pain or other complaints.      Past Medical History:  Diagnosis Date  . Back pain     Patient Active Problem List   Diagnosis Date Noted  . Back pain at L4-L5 level 10/10/2015  . DDD (degenerative disc disease), lumbosacral 10/10/2015  . Radiculopathy of lumbosacral region 10/10/2015    Past Surgical History:  Procedure Laterality Date  . CESAREAN SECTION    . TUBAL LIGATION       OB History    Gravida  4   Para  4   Term  4   Preterm      AB      Living  4     SAB      TAB      Ectopic      Multiple      Live Births               Home Medications    Prior to Admission medications   Medication Sig Start Date End Date Taking? Authorizing Provider  albuterol (PROVENTIL HFA;VENTOLIN HFA) 108 (90 Base) MCG/ACT inhaler Inhale 1-2 puffs into the lungs every 6 (six) hours as needed (cough). 06/24/18   Sabas Sous, MD  albuterol (PROVENTIL HFA;VENTOLIN HFA) 108 (90 Base) MCG/ACT inhaler Inhale 1-2 puffs into the lungs every 6 (six) hours as needed for wheezing or shortness of breath. 07/10/18   Donnetta Hutching, MD  diazepam (VALIUM) 2 MG tablet Take 1 tablet (2 mg total) by mouth 2 (two) times daily for 5 days. 08/28/18 09/02/18  Claude Manges, PA-C  doxycycline  (VIBRAMYCIN) 100 MG capsule Take 1 capsule (100 mg total) by mouth 2 (two) times daily. 07/10/18   Donnetta Hutching, MD  ibuprofen (ADVIL,MOTRIN) 600 MG tablet Take 1 tablet (600 mg total) by mouth 4 (four) times daily. Patient taking differently: Take 400 mg by mouth 4 (four) times daily.  05/22/18   Ivery Quale, PA-C  IUD'S IU by Intrauterine route once.    [provider]  magic mouthwash w/lidocaine SOLN Take 5 mLs by mouth 3 (three) times daily as needed for mouth pain. Swish and spit, do not swallow Patient not taking: Reported on 07/10/2018 04/08/18   Triplett, Tammy, PA-C  predniSONE (DELTASONE) 10 MG tablet 3 tabs for 3 days, 2 tabs for 3 days, 1 tab for 3 days. 07/10/18   Donnetta Hutching, MD  traMADol Janean Sark) 50 MG tablet 1 or 2 po q6h prn pain Patient not taking: Reported on 07/10/2018 05/22/18   Ivery Quale, PA-C    Family History Family History  Problem Relation Age of Onset  . Cancer Mother   . Hypertension Mother     Social History Social History   Tobacco Use  . Smoking status: Current Every  Day Smoker    Packs/day: 0.50    Types: Cigarettes  . Smokeless tobacco: Never Used  Substance Use Topics  . Alcohol use: No  . Drug use: No     Allergies   Tuberculin tests and Latex   Review of Systems Review of Systems  Constitutional: Negative for chills and fever.  HENT: Negative for ear pain and sore throat.   Eyes: Negative for pain and visual disturbance.  Respiratory: Negative for cough and shortness of breath.   Cardiovascular: Negative for chest pain and palpitations.  Gastrointestinal: Negative for abdominal pain and vomiting.  Genitourinary: Negative for dysuria and hematuria.  Musculoskeletal: Negative for arthralgias and back pain.  Skin: Negative for color change and rash.  Neurological: Positive for numbness. Negative for seizures, syncope, facial asymmetry and light-headedness.  All other systems reviewed and are negative.    Physical  Exam Updated Vital Signs BP (!) 160/110 (BP Location: Right Arm)   Pulse 95   Temp 98.7 F (37.1 C) (Oral)   Resp 16   Ht 6\' 2"  (1.88 m)   Wt (!) 180.5 kg   SpO2 97%   BMI 51.10 kg/m   Physical Exam Vitals signs and nursing note reviewed.  Constitutional:      General: She is not in acute distress.    Appearance: She is well-developed.  HENT:     Head: Normocephalic and atraumatic.      Comments: Describes tingling and numbness along the described region above. No facial asymmetry noted during exam.      Mouth/Throat:     Pharynx: No oropharyngeal exudate.  Eyes:     Pupils: Pupils are equal, round, and reactive to light.  Neck:     Musculoskeletal: Normal range of motion.  Cardiovascular:     Rate and Rhythm: Regular rhythm.     Heart sounds: Normal heart sounds.  Pulmonary:     Effort: Pulmonary effort is normal. No respiratory distress.     Breath sounds: Normal breath sounds.  Abdominal:     General: Bowel sounds are normal. There is no distension.     Palpations: Abdomen is soft.     Tenderness: There is no abdominal tenderness.  Musculoskeletal:        General: No tenderness or deformity.     Right lower leg: No edema.     Left lower leg: No edema.  Skin:    General: Skin is warm and dry.  Neurological:     Mental Status: She is alert and oriented to person, place, and time.     Comments: Alert, oriented, thought content appropriate. Speech fluent without evidence of aphasia. Able to follow 2 step commands without difficulty.  Cranial Nerves:  II:  Peripheral visual fields grossly normal, pupils, round, reactive to light III,IV, VI: ptosis not present, extra-ocular motions intact bilaterally  V,VII: smile symmetric, facial light touch sensation equal VIII: hearing grossly normal bilaterally  IX,X: midline uvula rise  XI: bilateral shoulder shrug equal and strong XII: midline tongue extension  Motor:  5/5 in upper and lower extremities bilaterally  including strong and equal grip strength and dorsiflexion/plantar flexion Sensory: light touch normal in all extremities.  Cerebellar: normal finger-to-nose with bilateral upper extremities, pronator drift negative        ED Treatments / Results  Labs (all labs ordered are listed, but only abnormal results are displayed) Labs Reviewed  COMPREHENSIVE METABOLIC PANEL - Abnormal; Notable for the following components:      Result  Value   Glucose, Bld 103 (*)    Albumin 3.4 (*)    Anion gap 4 (*)    All other components within normal limits  CBC WITH DIFFERENTIAL/PLATELET - Abnormal; Notable for the following components:   MCHC 29.6 (*)    All other components within normal limits  URINALYSIS, ROUTINE W REFLEX MICROSCOPIC - Abnormal; Notable for the following components:   APPearance HAZY (*)    All other components within normal limits  CBG MONITORING, ED    EKG None  Radiology No results found.  Procedures Procedures (including critical care time)  Medications Ordered in ED Medications  diazepam (VALIUM) tablet 2 mg (2 mg Oral Given 08/28/18 0015)     Initial Impression / Assessment and Plan / ED Course  I have reviewed the triage vital signs and the nursing notes.  Pertinent labs & imaging results that were available during my care of the patient were reviewed by me and considered in my medical decision making (see chart for details).   CBC showed no leukocytosis, hemoglobin within normal limits. CBG was 85, within normal limits, UA showed no nitrites, leukocytes, wbc's overall reassuring. CMP showed no hypokalemia to explain her nerve tingling. No hyponatremia or other electrolyte abnormality. Albumin is 3.4.  Patient given valium for her symptoms. No facial asymmetry, No facial droop, low suspicion for bell's palsy or central deficit.   At this time no imaging is warranted, patient has no temporal tenderness low suspicion for a temporal arthritis.  She reports no  changes in vision, no headache at this time.  She reports improvement after Valium and feeling a tingling sensation to her mouth region but none to her orbital or jaw or region.  At this time will send patient to follow-up with neurology for further evaluation.  She will also be sent home on Valium which helps with her symptoms.  Return precautions provided at length.  Final Clinical Impressions(s) / ED Diagnoses   Final diagnoses:  Numbness    ED Discharge Orders         Ordered    diazepam (VALIUM) 2 MG tablet  2 times daily     08/28/18 0033           Claude Manges, PA-C 08/28/18 0033    Loren Racer, MD 08/29/18 2310

## 2018-08-27 NOTE — ED Notes (Signed)
Pt back from restroom

## 2018-08-27 NOTE — ED Triage Notes (Signed)
Patient reports difference in sensation to the right side of her face around her nose, underneath her eye, and along the right side of her tongue. No neuro deficit. Cranial nerves grossly intact. Patient states symptoms started about 2 hours ago. No slurring of speech, no visual change.

## 2018-08-27 NOTE — ED Notes (Signed)
Notified of urine sample

## 2018-08-27 NOTE — Discharge Instructions (Addendum)
Your laboratory studies were within normal limits today. I have provided a referral to neurology, please schedule an appointment to further evaluate your facial tingling. If you experience any chest pain, shortness of breath or facial asymmetry please return to the ED.

## 2018-08-28 LAB — COMPREHENSIVE METABOLIC PANEL
ALK PHOS: 110 U/L (ref 38–126)
ALT: 28 U/L (ref 0–44)
ANION GAP: 4 — AB (ref 5–15)
AST: 22 U/L (ref 15–41)
Albumin: 3.4 g/dL — ABNORMAL LOW (ref 3.5–5.0)
BILIRUBIN TOTAL: 0.3 mg/dL (ref 0.3–1.2)
BUN: 7 mg/dL (ref 6–20)
CALCIUM: 8.9 mg/dL (ref 8.9–10.3)
CO2: 27 mmol/L (ref 22–32)
Chloride: 106 mmol/L (ref 98–111)
Creatinine, Ser: 0.67 mg/dL (ref 0.44–1.00)
GFR calc Af Amer: >60 mL/min (ref >60)
GFR calc non Af Amer: >60 mL/min (ref >60)
Glucose, Bld: 103 mg/dL — ABNORMAL HIGH (ref 70–99)
Potassium: 3.6 mmol/L (ref 3.5–5.1)
SODIUM: 137 mmol/L (ref 135–145)
Total Protein: 7.1 g/dL (ref 6.5–8.1)

## 2018-08-28 MED ORDER — DIAZEPAM 2 MG PO TABS: 2.0000 mg | ORAL_TABLET | Freq: Two times a day (BID) | ORAL | 0 refills | Status: AC

## 2018-08-28 NOTE — ED Notes (Signed)
Discharge vitals deferred

## 2019-02-02 ENCOUNTER — Other Ambulatory Visit (HOSPITAL_COMMUNITY): Payer: Self-pay | Admitting: Neurology

## 2019-02-02 ENCOUNTER — Other Ambulatory Visit: Payer: Self-pay | Admitting: Neurology

## 2019-02-02 DIAGNOSIS — R200 Anesthesia of skin: Secondary | ICD-10-CM

## 2019-02-17 ENCOUNTER — Ambulatory Visit: Admission: RE | Admit: 2019-02-17 | Payer: BLUE CROSS/BLUE SHIELD | Source: Ambulatory Visit

## 2019-03-10 ENCOUNTER — Other Ambulatory Visit: Payer: Self-pay

## 2019-03-10 ENCOUNTER — Ambulatory Visit (INDEPENDENT_AMBULATORY_CARE_PROVIDER_SITE_OTHER): Payer: Self-pay | Admitting: Family Medicine

## 2019-03-10 ENCOUNTER — Encounter: Payer: Self-pay | Admitting: Family Medicine

## 2019-03-10 DIAGNOSIS — R635 Abnormal weight gain: Secondary | ICD-10-CM

## 2019-03-10 DIAGNOSIS — Z7689 Persons encountering health services in other specified circumstances: Secondary | ICD-10-CM

## 2019-03-10 DIAGNOSIS — K146 Glossodynia: Secondary | ICD-10-CM

## 2019-03-10 DIAGNOSIS — F329 Major depressive disorder, single episode, unspecified: Secondary | ICD-10-CM

## 2019-03-10 DIAGNOSIS — R4589 Other symptoms and signs involving emotional state: Secondary | ICD-10-CM

## 2019-03-10 MED ORDER — NYSTATIN 100000 UNIT/ML MT SUSP: 5.0000 mL | Freq: Four times a day (QID) | OROMUCOSAL | 0 refills | Status: DC

## 2019-03-10 NOTE — Progress Notes (Addendum)
There were no vitals taken for this visit.   Subjective:    Patient ID: Phyllis Daniels, female    DOB: [birth year], 38 y.o.   MRN: [id number]  HPI: Phyllis Daniels is a 38 y.o. female  Chief Complaint  Patient presents with  . Establish Care  . Numbness    tongue, right side- not really numb any longer just a burning sensation     . This visit was completed via WebEx due to the restrictions of the COVID-19 pandemic. All issues as above were discussed and addressed. Physical exam was done as above through visual confirmation on WebEx. If it was felt that the patient should be evaluated in the office, they were directed there. The patient verbally consented to this visit. . Location of the patient: home . Location of the provider: work . Those involved with this call:  . Provider: Merrie Roof, PA-C . CMA: Merilyn Baba, [place] . Front Desk/Registration: Jill Side  . Time spent on call: 25 minutes with patient face to face via video conference. More than 50% of this time was spent in counseling and coordination of care. 10 minutes total spent in review of patient's record and preparation of their chart. I verified patient identity using two factors (patient name and date of birth). Patient consents verbally to being seen via telemedicine visit today.   Patient presents today to establish care. No known chronic medical problems and not currently taking any medications.   Started on phentermine back in January for weight loss. Was unable to continue this due to insurance coverage with the provider she was with. Still struggling with getting some weight off. Thinks this would help her chronic back pain quite a bit. Not regularly exercising, trying to watch what she eats.   Around 6 months ago, went to ER for numbness on right side of face. Now not so much a numbness but right side of tongue and roof of mouth burns particularly with smoking a cigarette or eating something spicy. Being worked up  through Neurology for all of this and was to have an MRI brain last month but did not have the money at that time. Has not been trying anything OTC for sxs.   Also struggling with moods, thinks it's related to her chronic back pain and the limitations that go along with that. No hx of depression, anxiety and has never been on psychotropic medications. Denies SI/HI. Very interested in counseling and feels this would benefit her quite a bit.   Depression screen [facility] 2/9 03/10/2019 10/10/2015  Decreased Interest 1 0  Down, Depressed, Hopeless 3 0  PHQ - 2 Score 4 0  Altered sleeping 3 -  Tired, decreased energy 1 -  Change in appetite 1 -  Feeling bad or failure about yourself  0 -  Trouble concentrating 1 -  Moving slowly or fidgety/restless 0 -  Suicidal thoughts 0 -  PHQ-9 Score 10 -  Difficult doing work/chores Somewhat difficult -   Relevant past medical, surgical, family and social history reviewed and updated as indicated. Interim medical history since our last visit reviewed. Allergies and medications reviewed and updated.  Review of Systems  Per HPI unless specifically indicated above     Objective:    There were no vitals taken for this visit.  Wt Readings from Last 3 Encounters:  08/27/18 (!) 398 lb (180.5 kg)  07/10/18 (!) 384 lb (174.2 kg)  06/24/18 (!) 384 lb (174.2 kg)    Physical  Exam Vitals signs and nursing note reviewed.  Constitutional:      General: She is not in acute distress.    Appearance: Normal appearance.  HENT:     Head: Atraumatic.     Right Ear: External ear normal.     Left Ear: External ear normal.     Nose: Nose normal. No congestion.     Mouth/Throat:     Mouth: Mucous membranes are moist.     Pharynx: Oropharynx is clear. No posterior oropharyngeal erythema.  Eyes:     Extraocular Movements: Extraocular movements intact.     Conjunctiva/sclera: Conjunctivae normal.  Neck:     Musculoskeletal: Normal range of motion.  Cardiovascular:      Comments: Unable to assess via virtual visit Pulmonary:     Effort: Pulmonary effort is normal. No respiratory distress.  Musculoskeletal: Normal range of motion.  Skin:    General: Skin is dry.     Findings: No erythema.  Neurological:     Mental Status: She is alert and oriented to person, place, and time.  Psychiatric:        Mood and Affect: Mood normal.        Thought Content: Thought content normal.        Judgment: Judgment normal.     Results for orders placed or performed during the hospital encounter of 08/27/18  Comprehensive metabolic panel  Result Value Ref Range   Sodium 137 135 - 145 mmol/L   Potassium 3.6 3.5 - 5.1 mmol/L   Chloride 106 98 - 111 mmol/L   CO2 27 22 - 32 mmol/L   Glucose, Bld 103 (H) 70 - 99 mg/dL   BUN 7 6 - 20 mg/dL   Creatinine, Ser 4.130.67 0.44 - 1.00 mg/dL   Calcium 8.9 8.9 - 24.410.3 mg/dL   Total Protein 7.1 6.5 - 8.1 g/dL   Albumin 3.4 (L) 3.5 - 5.0 g/dL   AST 22 15 - 41 U/L   ALT 28 0 - 44 U/L   Alkaline Phosphatase 110 38 - 126 U/L   Total Bilirubin 0.3 0.3 - 1.2 mg/dL   GFR calc non Af Amer >60 >60 mL/min   GFR calc Af Amer >60 >60 mL/min   Anion gap 4 (L) 5 - 15  CBC with Differential  Result Value Ref Range   WBC 8.4 4.0 - 10.5 K/uL   RBC 4.74 3.87 - 5.11 MIL/uL   Hemoglobin 12.8 12.0 - 15.0 g/dL   HCT 01.043.3 27.236.0 - 53.646.0 %   MCV 91.4 80.0 - 100.0 fL   MCH 27.0 26.0 - 34.0 pg   MCHC 29.6 (L) 30.0 - 36.0 g/dL   RDW 64.414.5 03.411.5 - 74.215.5 %   Platelets 336 150 - 400 K/uL   nRBC 0.0 0.0 - 0.2 %   Neutrophils Relative % 54 %   Neutro Abs 4.6 1.7 - 7.7 K/uL   Lymphocytes Relative 32 %   Lymphs Abs 2.7 0.7 - 4.0 K/uL   Monocytes Relative 8 %   Monocytes Absolute 0.7 0.1 - 1.0 K/uL   Eosinophils Relative 5 %   Eosinophils Absolute 0.4 0.0 - 0.5 K/uL   Basophils Relative 1 %   Basophils Absolute 0.1 0.0 - 0.1 K/uL   Immature Granulocytes 0 %   Abs Immature Granulocytes 0.03 0.00 - 0.07 K/uL  Urinalysis, Routine w reflex microscopic   Result Value Ref Range   Color, Urine YELLOW YELLOW   APPearance HAZY (A) CLEAR  Specific Gravity, Urine 1.016 1.005 - 1.030   pH 7.0 5.0 - 8.0   Glucose, UA NEGATIVE NEGATIVE mg/dL   Hgb urine dipstick NEGATIVE NEGATIVE   Bilirubin Urine NEGATIVE NEGATIVE   Ketones, ur NEGATIVE NEGATIVE mg/dL   Protein, ur NEGATIVE NEGATIVE mg/dL   Nitrite NEGATIVE NEGATIVE   Leukocytes, UA NEGATIVE NEGATIVE  POC CBG, ED  Result Value Ref Range   Glucose-Capillary 85 70 - 99 mg/dL      Assessment & Plan:   Problem List Items Addressed This Visit    None    Visit Diagnoses    Depressed mood    -  Primary   Referral placed to Psychiatry per her request for recommendations for counseling/med management   Relevant Orders   Ambulatory referral to Psychiatry   Encounter to establish care       Tongue pain       Poor visualization of area due to virtual nature of visit, but while awaiting Neuro workup will tx with nystatin in case thrush-related.    Weight gain       Unable to follow with previous prescriber d/t insurance. Will work on diet and exercise for now, does not wish to try other medicines right now       Follow up plan: Return in about 5 months (around 08/10/2019) for CPE.

## 2019-04-03 ENCOUNTER — Telehealth: Payer: Self-pay

## 2019-04-03 NOTE — Telephone Encounter (Signed)
Copied from [place] [id number]. Topic: Referral - Request for Referral >> Apr 03, 2019  1:19 PM Rainey Pines A wrote: Has patient seen PCP for this complaint? Yes *If NO, is insurance requiring patient see PCP for this issue before PCP can refer them? Referral for which specialty: Weight management Preferred provider/office: [facility] Reason for referral: Bariatric surgery

## 2019-04-04 NOTE — Telephone Encounter (Signed)
Referral placed.

## 2019-06-18 ENCOUNTER — Other Ambulatory Visit: Payer: Self-pay

## 2019-06-18 ENCOUNTER — Encounter (HOSPITAL_COMMUNITY): Payer: Self-pay

## 2019-06-18 ENCOUNTER — Emergency Department (HOSPITAL_COMMUNITY): Payer: BLUE CROSS/BLUE SHIELD

## 2019-06-18 ENCOUNTER — Emergency Department (HOSPITAL_COMMUNITY)
Admission: EM | Admit: 2019-06-18 | Discharge: 2019-06-18 | Disposition: A | Discharge status: Home / Self Care | Payer: BLUE CROSS/BLUE SHIELD | Attending: Emergency Medicine | Admitting: Emergency Medicine

## 2019-06-18 DIAGNOSIS — G8929 Other chronic pain: Secondary | ICD-10-CM | POA: Insufficient documentation

## 2019-06-18 DIAGNOSIS — F17210 Nicotine dependence, cigarettes, uncomplicated: Secondary | ICD-10-CM | POA: Insufficient documentation

## 2019-06-18 DIAGNOSIS — M545 Low back pain, unspecified: Secondary | ICD-10-CM

## 2019-06-18 DIAGNOSIS — Z79899 Other long term (current) drug therapy: Secondary | ICD-10-CM | POA: Insufficient documentation

## 2019-06-18 MED ORDER — HYDROMORPHONE HCL 1 MG/ML IJ SOLN
1.0000 mg | Freq: Once | INTRAMUSCULAR | Status: AC
  Administered 2019-06-18: 1 mg via INTRAMUSCULAR
  Filled 2019-06-18: qty 1

## 2019-06-18 MED ORDER — METHOCARBAMOL 500 MG PO TABS: 500.0000 mg | ORAL_TABLET | Freq: Two times a day (BID) | ORAL | 0 refills | Status: DC

## 2019-06-18 MED ORDER — DICLOFENAC SODIUM 75 MG PO TBEC: 75.0000 mg | DELAYED_RELEASE_TABLET | Freq: Two times a day (BID) | ORAL | 0 refills | Status: DC

## 2019-06-18 MED ORDER — KETOROLAC TROMETHAMINE 60 MG/2ML IM SOLN
60.0000 mg | Freq: Once | INTRAMUSCULAR | Status: AC
  Administered 2019-06-18: 60 mg via INTRAMUSCULAR
  Filled 2019-06-18: qty 2

## 2019-06-18 NOTE — Discharge Instructions (Addendum)
See Dr. Arnoldo Morale if any problems.

## 2019-06-18 NOTE — ED Notes (Addendum)
Pt with low back pain since Friday  She ambulates heel to toe slowly, truck slightly forward  Describes numbness to legs  Denies incontinence although she wears a depends at night as it is difficult to arise to go urinate

## 2019-06-18 NOTE — ED Triage Notes (Signed)
Pt reports right back pain into buttocks. Pain and numbness down both legs. Pt has constant pinching and stabbing. Pt has hx of sciatica. Pain started friday

## 2019-06-18 NOTE — ED Provider Notes (Signed)
[facility] EMERGENCY DEPARTMENT Provider Note   CSN: [id number] Arrival date & time: 06/18/19  7124     History   Chief Complaint Chief Complaint  Patient presents with  . Back Pain    HPI Phyllis Daniels is a 38 y.o. female.     The history is provided by the patient. No language interpreter was used.  Back Pain Location:  Generalized Quality:  Aching Radiates to:  Does not radiate Pain severity:  Moderate Pain is:  Worse during the day Onset quality:  Gradual Timing:  Constant Progression:  Worsening Chronicity:  New Relieved by:  Nothing Worsened by:  Nothing Ineffective treatments:  None tried Associated symptoms: no numbness and no paresthesias   Risk factors: no hx of cancer     Past Medical History:  Diagnosis Date  . Abnormal Pap smear of cervix   . Back pain   . DDD (degenerative disc disease), lumbar     Patient Active Problem List   Diagnosis Date Noted  . Back pain at L4-L5 level 10/10/2015  . DDD (degenerative disc disease), lumbosacral 10/10/2015  . Radiculopathy of lumbosacral region 10/10/2015    Past Surgical History:  Procedure Laterality Date  . CESAREAN SECTION    . TUBAL LIGATION       OB History    Gravida  4   Para  4   Term  4   Preterm      AB      Living  4     SAB      TAB      Ectopic      Multiple      Live Births               Home Medications    Prior to Admission medications   Medication Sig Start Date End Date Taking? Authorizing Provider  diclofenac (VOLTAREN) 75 MG EC tablet Take 1 tablet (75 mg total) by mouth 2 (two) times daily. 06/18/19   Fransico Meadow, PA-C  IUD'S IU by Intrauterine route once.    [provider]  methocarbamol (ROBAXIN) 500 MG tablet Take 1 tablet (500 mg total) by mouth 2 (two) times daily. 06/18/19   Fransico Meadow, PA-C  nystatin (MYCOSTATIN) 100000 UNIT/ML suspension Take 5 mLs (500,000 Units total) by mouth 4 (four) times daily. 03/10/19   Volney American, PA-C    Family History Family History  Problem Relation Age of Onset  . Hypertension Mother   . Cancer Mother        Breast  . Lupus Sister   . Heart disease Maternal Grandfather   . Heart attack Paternal Grandmother   . Diabetes Daughter     Social History Social History   Tobacco Use  . Smoking status: Current Every Day Smoker    Packs/day: 0.50    Types: Cigarettes  . Smokeless tobacco: Never Used  Substance Use Topics  . Alcohol use: Yes    Comment: occassional  . Drug use: No     Allergies   Tuberculin tests and Latex   Review of Systems Review of Systems  Musculoskeletal: Positive for back pain.  Neurological: Negative for numbness and paresthesias.  All other systems reviewed and are negative.    Physical Exam Updated Vital Signs BP (!) 150/93 (BP Location: Right Wrist)   Pulse 87   Temp 98.3 F (36.8 C) (Oral)   Resp 20   SpO2 96%   Physical Exam Vitals signs  and nursing note reviewed.  Constitutional:      Appearance: She is well-developed.  HENT:     Head: Normocephalic.     Right Ear: Tympanic membrane normal.     Mouth/Throat:     Mouth: Mucous membranes are moist.  Eyes:     Pupils: Pupils are equal, round, and reactive to light.  Neck:     Musculoskeletal: Normal range of motion.  Cardiovascular:     Rate and Rhythm: Normal rate and regular rhythm.  Pulmonary:     Effort: Pulmonary effort is normal.  Abdominal:     General: Abdomen is flat. There is no distension.  Musculoskeletal: Normal range of motion.  Skin:    General: Skin is warm.  Neurological:     Mental Status: She is alert and oriented to person, place, and time.  Psychiatric:        Mood and Affect: Mood normal.      ED Treatments / Results  Labs (all labs ordered are listed, but only abnormal results are displayed) Labs Reviewed - No data to display  EKG None  Radiology No results found.  Procedures Procedures (including critical  care time)  Medications Ordered in ED Medications  ketorolac (TORADOL) injection 60 mg (60 mg Intramuscular Given 06/18/19 1153)  HYDROmorphone (DILAUDID) injection 1 mg (1 mg Intramuscular Given 06/18/19 1153)     Initial Impression / Assessment and Plan / ED Course  I have reviewed the triage vital signs and the nursing notes.  Pertinent labs & imaging results that were available during my care of the patient were reviewed by me and considered in my medical decision making (see chart for details).        MDM  Pt request a shot of toradol and dilaudid.  Pt given rx for voltaren and robaxin Pt reports she will follow up with Dr. Lovell Sheehan.  Final Clinical Impressions(s) / ED Diagnoses   Final diagnoses:  Chronic low back pain without sciatica, unspecified back pain laterality    ED Discharge Orders         Ordered    diclofenac (VOLTAREN) 75 MG EC tablet  2 times daily     06/18/19 1127    methocarbamol (ROBAXIN) 500 MG tablet  2 times daily     06/18/19 1127        An After Visit Summary was printed and given to the patient.    Elson Areas, PA-C 06/18/19 1534    Donnetta Hutching, MD 06/21/19 [phone]

## 2019-12-22 ENCOUNTER — Encounter: Payer: Medicaid Other | Admitting: Family Medicine

## 2020-01-21 ENCOUNTER — Emergency Department (HOSPITAL_COMMUNITY)
Admission: EM | Admit: 2020-01-21 | Discharge: 2020-01-21 | Disposition: A | Discharge status: Home / Self Care | Payer: BLUE CROSS/BLUE SHIELD | Attending: Emergency Medicine | Admitting: Emergency Medicine

## 2020-01-21 ENCOUNTER — Other Ambulatory Visit: Payer: Self-pay

## 2020-01-21 ENCOUNTER — Encounter (HOSPITAL_COMMUNITY): Payer: Self-pay | Admitting: Emergency Medicine

## 2020-01-21 DIAGNOSIS — Z91040 Latex allergy status: Secondary | ICD-10-CM | POA: Diagnosis not present

## 2020-01-21 DIAGNOSIS — M545 Low back pain: Secondary | ICD-10-CM | POA: Diagnosis present

## 2020-01-21 DIAGNOSIS — M5440 Lumbago with sciatica, unspecified side: Secondary | ICD-10-CM

## 2020-01-21 DIAGNOSIS — M5441 Lumbago with sciatica, right side: Secondary | ICD-10-CM | POA: Diagnosis not present

## 2020-01-21 DIAGNOSIS — F17210 Nicotine dependence, cigarettes, uncomplicated: Secondary | ICD-10-CM | POA: Insufficient documentation

## 2020-01-21 MED ORDER — NAPROXEN 500 MG PO TABS: 500.0000 mg | ORAL_TABLET | Freq: Two times a day (BID) | ORAL | 0 refills | Status: DC

## 2020-01-21 MED ORDER — KETOROLAC TROMETHAMINE 30 MG/ML IJ SOLN
30.0000 mg | Freq: Once | INTRAMUSCULAR | Status: AC
  Administered 2020-01-21: 30 mg via INTRAMUSCULAR
  Filled 2020-01-21: qty 1

## 2020-01-21 NOTE — ED Notes (Signed)
Pt here for eval of chronic back pain   She reports increased exercise this past week and believes it exacerbated her pain   She ambulates heel to toe with an even unlabored gait   Denies incontinence   Here for pain

## 2020-01-21 NOTE — ED Provider Notes (Signed)
[facility] EMERGENCY DEPARTMENT Provider Note   CSN: [id number] Arrival date & time: 01/21/20  1446     History Chief Complaint  Patient presents with  . Back Pain    Phyllis Daniels is a 39 y.o. female.  HPI   Patient is a 39 year old female with a history of abnormal Pap smear of the cervix, chronic back pain, who presents to the emergency department today for evaluation of right lower back pain.  Patient states she has a chronic history of similar pain that intermittently flares up.  She used to be in pain management for this but she is not any longer due to Covid.  States that last week she was on vacation at the beach and also went to the [place] earlier this week.  She did a lot of swimming and thinks that she aggravated her back because she has had increasing pain to the lower back for the last several days.  Pain is constant and severe in nature.  It feels like a sharp pain.  Pain radiates down the right lower extremity.  She felt like she had some weakness today which is why she came to the ED.  Denies any associated numbness.  Denies loss control of bowel or bladder function, history of cancer or IVDU.  Denies saddle anesthesia or any other associated symptoms.  States her symptoms are usually all resolved when she gets a shot in the ED.  Past Medical History:  Diagnosis Date  . Abnormal Pap smear of cervix   . Back pain   . DDD (degenerative disc disease), lumbar     Patient Active Problem List   Diagnosis Date Noted  . Back pain at L4-L5 level 10/10/2015  . DDD (degenerative disc disease), lumbosacral 10/10/2015  . Radiculopathy of lumbosacral region 10/10/2015    Past Surgical History:  Procedure Laterality Date  . CESAREAN SECTION    . TUBAL LIGATION       OB History    Gravida  4   Para  4   Term  4   Preterm      AB      Living  4     SAB      TAB      Ectopic      Multiple      Live Births              Family History  Problem Relation  Age of Onset  . Hypertension Mother   . Cancer Mother        Breast  . Lupus Sister   . Heart disease Maternal Grandfather   . Heart attack Paternal Grandmother   . Diabetes Daughter     Social History   Tobacco Use  . Smoking status: Current Every Day Smoker    Packs/day: 0.50    Types: Cigarettes  . Smokeless tobacco: Never Used  Vaping Use  . Vaping Use: Never used  Substance Use Topics  . Alcohol use: Yes    Comment: occassional  . Drug use: No    Home Medications Prior to Admission medications   Medication Sig Start Date End Date Taking? Authorizing Provider  Cholecalciferol (VITAMIN D) 50 MCG (2000 UT) CAPS Take 1 capsule by mouth daily.   Yes [provider]  IUD'S IU by Intrauterine route once.   Yes [provider]  naproxen (NAPROSYN) 500 MG tablet Take 1 tablet (500 mg total) by mouth 2 (two) times daily with a meal for  7 days. 01/21/20 01/28/20  Genetta Fiero S, PA-C    Allergies    Tuberculin tests and Latex  Review of Systems   Review of Systems  Constitutional: Negative for fever.  Respiratory: Negative for shortness of breath.   Cardiovascular: Negative for chest pain.  Gastrointestinal: Negative for abdominal pain, blood in stool, constipation, diarrhea, nausea and vomiting.  Genitourinary: Negative for dysuria, flank pain, frequency, hematuria and urgency.  Musculoskeletal: Positive for back pain. Negative for gait problem.  Skin: Negative for wound.  Neurological: Negative for weakness and numbness.    Physical Exam Updated Vital Signs Pulse 86   Temp 97.7 F (36.5 C) (Oral)   Resp 18   Ht 6\' 2"  (1.88 m)   Wt (!) 180.5 kg   SpO2 99%   BMI 51.10 kg/m   Physical Exam Vitals and nursing note reviewed.  Constitutional:      General: She is not in acute distress.    Appearance: She is well-developed.  HENT:     Head: Normocephalic and atraumatic.  Eyes:     Conjunctiva/sclera: Conjunctivae normal.  Cardiovascular:      Rate and Rhythm: Normal rate.  Pulmonary:     Effort: Pulmonary effort is normal.  Musculoskeletal:        General: Normal range of motion.     Cervical back: Neck supple.     Comments: TTP to the lumbar spine and over the right sciatic notch which reproduces pain  Skin:    General: Skin is warm and dry.  Neurological:     Mental Status: She is alert.     Comments: 5/5 strength to BLE with flexion/extension/abduction/adduction at hips, flexion/extension at knees, and dorsiflexion/plantarflexion of feet. Sensation intact and symmetric bilaterally.      ED Results / Procedures / Treatments   Labs (all labs ordered are listed, but only abnormal results are displayed) Labs Reviewed - No data to display  EKG None  Radiology No results found.  Procedures Procedures (including critical care time)  Medications Ordered in ED Medications  ketorolac (TORADOL) 30 MG/ML injection 30 mg (has no administration in time range)    ED Course  I have reviewed the triage vital signs and the nursing notes.  Pertinent labs & imaging results that were available during my care of the patient were reviewed by me and considered in my medical decision making (see chart for details).    MDM Rules/Calculators/A&P                          Normal neurological exam, no evidence of urinary incontinence or retention, pain is consistently reproducible. There is no evidence of AAA or concern for dissection at this time.   Patient can walk but states is painful.  No loss of bowel or bladder control.  No concern for cauda equina.  No fever, night sweats, weight loss, h/o cancer, IVDU.  Pain treated here in the department with adequate improvement. RICE protocol and pain medicine indicated and discussed with patient. I have also discussed reasons to return immediately to the ER.  Patient expresses understanding and agrees with plan.  Final Clinical Impression(s) / ED Diagnoses Final diagnoses:  Acute low  back pain with sciatica, sciatica laterality unspecified, unspecified back pain laterality    Rx / DC Orders ED Discharge Orders         Ordered    naproxen (NAPROSYN) 500 MG tablet  2 times daily with meals  Discontinue  Reprint     01/21/20 [address] 01/21/20 1625    Bethann Berkshire, MD 01/22/20 1048

## 2020-01-21 NOTE — Discharge Instructions (Signed)

## 2020-01-21 NOTE — ED Triage Notes (Signed)
Pt c/o of back pain since yesterday.

## 2020-01-22 ENCOUNTER — Telehealth: Payer: Self-pay | Admitting: Family Medicine

## 2020-01-22 NOTE — Telephone Encounter (Signed)
Copied from CRM [id number]. Topic: General - Inquiry >> Jan 22, 2020  9:08 AM Floria Raveling A wrote: Reason for CRM: pt called in stated she in Er yesterday and was told my the er Dr to contact her Primary to get a referral back to a Pain clinic , She would like to go somewhere in [place] or [place] .  Pt has [employer] home [place] number [phone]

## 2020-01-22 NOTE — Telephone Encounter (Signed)
Called pt scheduled for 01/25/20

## 2020-01-22 NOTE — Telephone Encounter (Signed)
Needs appt

## 2020-01-25 ENCOUNTER — Encounter: Payer: Self-pay | Admitting: Family Medicine

## 2020-01-25 ENCOUNTER — Ambulatory Visit: Payer: BLUE CROSS/BLUE SHIELD | Admitting: Family Medicine

## 2020-01-25 ENCOUNTER — Other Ambulatory Visit: Payer: Self-pay

## 2020-01-25 VITALS — BP 167/94 | HR 71 | Temp 98.1°F | Wt >= 6400 oz

## 2020-01-25 DIAGNOSIS — M5137 Other intervertebral disc degeneration, lumbosacral region: Secondary | ICD-10-CM

## 2020-01-25 DIAGNOSIS — F419 Anxiety disorder, unspecified: Secondary | ICD-10-CM | POA: Diagnosis not present

## 2020-01-25 DIAGNOSIS — M5417 Radiculopathy, lumbosacral region: Secondary | ICD-10-CM

## 2020-01-25 MED ORDER — DULOXETINE HCL 20 MG PO CPEP: 20.0000 mg | ORAL_CAPSULE | Freq: Every day | ORAL | 0 refills | Status: DC

## 2020-01-25 NOTE — Progress Notes (Signed)
BP (!) 167/94 Comment: left low arm  Pulse 71   Temp 98.1 F (36.7 C) (Oral)   Wt (!) 408 lb (185.1 kg)   SpO2 97%   BMI 52.38 kg/m    Subjective:    Patient ID: Phyllis Daniels, female    DOB: [birth year], 39 y.o.   MRN: [id number]  HPI: Phyllis Daniels is a 39 y.o. female  Chief Complaint  Patient presents with  . Back Pain    pt states went to the ED twice few days ago, was given torodol injection, methocarbamol, prednisone and ibuprofen. meds not helping  . Leg Pain    right side  . Referral    to pain clinic   Ongonig back pain, low back with sciatica to right leg.  wanting to go to pain clinic as interventions have never been helpful in the past. Has been to ER for this 3 times over the past 6 months without any benefit. Has failed muscle relaxers, NSAIDs, steroids,and exercises at this point. Denies falls, leg weakness, numbness, fevers, saddle paresthesias.   Anxiety is worsening over time due to significant stressors. Not currently on anything for this and nervous to start anything potentially dangerous or addictive. Denies SI/HI, mood swings.   Depression screen [facility] 2/9 03/10/2019 10/10/2015  Decreased Interest 1 0  Down, Depressed, Hopeless 3 0  PHQ - 2 Score 4 0  Altered sleeping 3 -  Tired, decreased energy 1 -  Change in appetite 1 -  Feeling bad or failure about yourself  0 -  Trouble concentrating 1 -  Moving slowly or fidgety/restless 0 -  Suicidal thoughts 0 -  PHQ-9 Score 10 -  Difficult doing work/chores Somewhat difficult -  No flowsheet data found.  Relevant past medical, surgical, family and social history reviewed and updated as indicated. Interim medical history since our last visit reviewed. Allergies and medications reviewed and updated.  Review of Systems  Per HPI unless specifically indicated above     Objective:    BP (!) 167/94 Comment: left low arm  Pulse 71   Temp 98.1 F (36.7 C) (Oral)   Wt (!) 408 lb (185.1 kg)   SpO2 97%   BMI  52.38 kg/m   Wt Readings from Last 3 Encounters:  01/25/20 (!) 408 lb (185.1 kg)  01/21/20 (!) 398 lb (180.5 kg)  08/27/18 (!) 398 lb (180.5 kg)    Physical Exam Vitals and nursing note reviewed.  Constitutional:      Appearance: Normal appearance. She is obese. She is not ill-appearing.  HENT:     Head: Atraumatic.  Eyes:     Extraocular Movements: Extraocular movements intact.     Conjunctiva/sclera: Conjunctivae normal.  Cardiovascular:     Rate and Rhythm: Normal rate and regular rhythm.     Heart sounds: Normal heart sounds.  Pulmonary:     Effort: Pulmonary effort is normal.     Breath sounds: Normal breath sounds.  Musculoskeletal:        General: Normal range of motion.     Cervical back: Normal range of motion and neck supple.     Comments: - SLR b/l  Skin:    General: Skin is warm and dry.  Neurological:     Mental Status: She is alert and oriented to person, place, and time.  Psychiatric:        Mood and Affect: Mood normal.        Thought Content: Thought content normal.  Judgment: Judgment normal.     Results for orders placed or performed during the hospital encounter of 08/27/18  Comprehensive metabolic panel  Result Value Ref Range   Sodium 137 135 - 145 mmol/L   Potassium 3.6 3.5 - 5.1 mmol/L   Chloride 106 98 - 111 mmol/L   CO2 27 22 - 32 mmol/L   Glucose, Bld 103 (H) 70 - 99 mg/dL   BUN 7 6 - 20 mg/dL   Creatinine, Ser 0.67 0.44 - 1.00 mg/dL   Calcium 8.9 8.9 - 10.3 mg/dL   Total Protein 7.1 6.5 - 8.1 g/dL   Albumin 3.4 (L) 3.5 - 5.0 g/dL   AST 22 15 - 41 U/L   ALT 28 0 - 44 U/L   Alkaline Phosphatase 110 38 - 126 U/L   Total Bilirubin 0.3 0.3 - 1.2 mg/dL   GFR calc non Af Amer >60 >60 mL/min   GFR calc Af Amer >60 >60 mL/min   Anion gap 4 (L) 5 - 15  CBC with Differential  Result Value Ref Range   WBC 8.4 4.0 - 10.5 K/uL   RBC 4.74 3.87 - 5.11 MIL/uL   Hemoglobin 12.8 12.0 - 15.0 g/dL   HCT 43.3 36 - 46 %   MCV 91.4 80.0 - 100.0  fL   MCH 27.0 26.0 - 34.0 pg   MCHC 29.6 (L) 30.0 - 36.0 g/dL   RDW 14.5 11.5 - 15.5 %   Platelets 336 150 - 400 K/uL   nRBC 0.0 0.0 - 0.2 %   Neutrophils Relative % 54 %   Neutro Abs 4.6 1.7 - 7.7 K/uL   Lymphocytes Relative 32 %   Lymphs Abs 2.7 0.7 - 4.0 K/uL   Monocytes Relative 8 %   Monocytes Absolute 0.7 0 - 1 K/uL   Eosinophils Relative 5 %   Eosinophils Absolute 0.4 0 - 0 K/uL   Basophils Relative 1 %   Basophils Absolute 0.1 0 - 0 K/uL   Immature Granulocytes 0 %   Abs Immature Granulocytes 0.03 0.00 - 0.07 K/uL  Urinalysis, Routine w reflex microscopic  Result Value Ref Range   Color, Urine YELLOW YELLOW   APPearance HAZY (A) CLEAR   Specific Gravity, Urine 1.016 1.005 - 1.030   pH 7.0 5.0 - 8.0   Glucose, UA NEGATIVE NEGATIVE mg/dL   Hgb urine dipstick NEGATIVE NEGATIVE   Bilirubin Urine NEGATIVE NEGATIVE   Ketones, ur NEGATIVE NEGATIVE mg/dL   Protein, ur NEGATIVE NEGATIVE mg/dL   Nitrite NEGATIVE NEGATIVE   Leukocytes, UA NEGATIVE NEGATIVE  POC CBG, ED  Result Value Ref Range   Glucose-Capillary 85 70 - 99 mg/dL      Assessment & Plan:   Problem List Items Addressed This Visit      Nervous and Auditory   Radiculopathy of lumbosacral region    Continue current regimen, referral generated to Pain Mgmt for further intervention. Discussed importance of weight loss to help reduce severity of sxs      Relevant Medications   methocarbamol (ROBAXIN) 500 MG tablet   DULoxetine (CYMBALTA) 20 MG capsule   Other Relevant Orders   Ambulatory referral to Pain Clinic     Musculoskeletal and Integument   DDD (degenerative disc disease), lumbosacral - Primary   Relevant Medications   ibuprofen (ADVIL) 400 MG tablet   methocarbamol (ROBAXIN) 500 MG tablet   predniSONE (DELTASONE) 20 MG tablet   Other Relevant Orders   Ambulatory referral to Pain Clinic  Other   Anxiety    Exacerbated by stressors. Will trial cymbalta and monitor closely for benefit. Hoping  this may also benefit her back pain with radiculopathy to some extent as well      Relevant Medications   DULoxetine (CYMBALTA) 20 MG capsule       Follow up plan: Return for 1 month anxiety, 3 month CPE.

## 2020-01-28 DIAGNOSIS — F419 Anxiety disorder, unspecified: Secondary | ICD-10-CM | POA: Insufficient documentation

## 2020-01-28 NOTE — Assessment & Plan Note (Signed)
Exacerbated by stressors. Will trial cymbalta and monitor closely for benefit. Hoping this may also benefit her back pain with radiculopathy to some extent as well

## 2020-01-28 NOTE — Assessment & Plan Note (Signed)
Continue current regimen, referral generated to Pain Mgmt for further intervention. Discussed importance of weight loss to help reduce severity of sxs

## 2020-02-08 ENCOUNTER — Encounter: Payer: Self-pay | Admitting: Family Medicine

## 2020-02-21 ENCOUNTER — Other Ambulatory Visit: Payer: Self-pay | Admitting: Family Medicine

## 2020-02-21 NOTE — Telephone Encounter (Signed)
Requested medication (s) are due for refill today: no  Requested medication (s) are on the active medication list: yes  Last refill:  01/25/2020  Future visit scheduled: yes  Notes to clinic:  patient has upcoming appointment on 7/28 30 day supply has been giving until 24th  Requested Prescriptions  Pending Prescriptions Disp Refills   DULoxetine (CYMBALTA) 20 MG capsule [Pharmacy Med Name: DULOXETINE DR 20MG  CAPSULES] 30 capsule 0    Sig: TAKE 1 CAPSULE(20 MG) BY MOUTH DAILY      Psychiatry: Antidepressants - SNRI Failed - 02/21/2020 10:18 AM      Failed - Last BP in normal range    BP Readings from Last 1 Encounters:  01/25/20 (!) 167/94          Passed - Valid encounter within last 6 months    Recent Outpatient Visits           3 weeks ago DDD (degenerative disc disease), lumbosacral   [facility] [place], [place]   11 months ago Depressed mood   [facility] [place], [place], [place]       Future Appointments             In 1 week [place], Maurice March, PA-C [facility]

## 2020-02-23 ENCOUNTER — Telehealth: Payer: Self-pay | Admitting: Family Medicine

## 2020-02-23 DIAGNOSIS — M5137 Other intervertebral disc degeneration, lumbosacral region: Secondary | ICD-10-CM

## 2020-02-23 DIAGNOSIS — M5417 Radiculopathy, lumbosacral region: Secondary | ICD-10-CM

## 2020-02-23 NOTE — Telephone Encounter (Signed)
Copied from CRM [id number]. Topic: General - Inquiry >> Feb 22, 2020 11:22 AM Phyllis Daniels Payment wrote: Reason for CRM: Patient is requesting new referral due to her insurance does not accept the current referral she has for [facility].  Patient states her insurance will take [facility] clinics. Call back [phone]

## 2020-02-23 NOTE — Telephone Encounter (Signed)
Referral generated

## 2020-02-23 NOTE — Telephone Encounter (Signed)
Called pt, straight to vm, no vm set up.

## 2020-02-28 ENCOUNTER — Ambulatory Visit: Payer: BLUE CROSS/BLUE SHIELD | Admitting: Family Medicine

## 2020-02-29 ENCOUNTER — Ambulatory Visit: Payer: BLUE CROSS/BLUE SHIELD | Admitting: Family Medicine

## 2021-10-27 DIAGNOSIS — I10 Essential (primary) hypertension: Secondary | ICD-10-CM | POA: Diagnosis not present

## 2021-10-27 DIAGNOSIS — Z6841 Body Mass Index (BMI) 40.0 and over, adult: Secondary | ICD-10-CM | POA: Diagnosis not present

## 2021-10-27 DIAGNOSIS — G4739 Other sleep apnea: Secondary | ICD-10-CM | POA: Diagnosis not present

## 2021-12-15 DIAGNOSIS — G4739 Other sleep apnea: Secondary | ICD-10-CM | POA: Diagnosis not present

## 2021-12-15 DIAGNOSIS — Z6841 Body Mass Index (BMI) 40.0 and over, adult: Secondary | ICD-10-CM | POA: Diagnosis not present

## 2021-12-15 DIAGNOSIS — J4520 Mild intermittent asthma, uncomplicated: Secondary | ICD-10-CM | POA: Diagnosis not present

## 2021-12-15 DIAGNOSIS — G4730 Sleep apnea, unspecified: Secondary | ICD-10-CM | POA: Diagnosis not present

## 2021-12-15 DIAGNOSIS — I10 Essential (primary) hypertension: Secondary | ICD-10-CM | POA: Diagnosis not present

## 2022-01-19 DIAGNOSIS — R32 Unspecified urinary incontinence: Secondary | ICD-10-CM | POA: Diagnosis not present

## 2022-01-19 DIAGNOSIS — Z124 Encounter for screening for malignant neoplasm of cervix: Secondary | ICD-10-CM | POA: Diagnosis not present

## 2022-01-21 DIAGNOSIS — G4733 Obstructive sleep apnea (adult) (pediatric): Secondary | ICD-10-CM | POA: Diagnosis not present

## 2022-01-21 DIAGNOSIS — Z6841 Body Mass Index (BMI) 40.0 and over, adult: Secondary | ICD-10-CM | POA: Diagnosis not present

## 2022-01-23 ENCOUNTER — Other Ambulatory Visit (HOSPITAL_COMMUNITY): Payer: Self-pay | Admitting: Surgery

## 2022-01-23 ENCOUNTER — Other Ambulatory Visit: Payer: Self-pay | Admitting: Surgery

## 2022-02-05 ENCOUNTER — Ambulatory Visit: Payer: BLUE CROSS/BLUE SHIELD | Admitting: Dietician

## 2022-02-23 ENCOUNTER — Ambulatory Visit: Payer: 59 | Admitting: Dietician

## 2022-02-24 ENCOUNTER — Encounter: Payer: Self-pay | Admitting: Dietician

## 2022-02-24 ENCOUNTER — Encounter: Payer: 59 | Attending: Surgery | Admitting: Dietician

## 2022-02-24 DIAGNOSIS — Z713 Dietary counseling and surveillance: Secondary | ICD-10-CM | POA: Diagnosis not present

## 2022-02-24 DIAGNOSIS — E669 Obesity, unspecified: Secondary | ICD-10-CM

## 2022-02-24 DIAGNOSIS — F3289 Other specified depressive episodes: Secondary | ICD-10-CM | POA: Diagnosis not present

## 2022-02-24 DIAGNOSIS — F419 Anxiety disorder, unspecified: Secondary | ICD-10-CM | POA: Insufficient documentation

## 2022-02-24 DIAGNOSIS — M545 Low back pain, unspecified: Secondary | ICD-10-CM | POA: Diagnosis not present

## 2022-02-24 DIAGNOSIS — R29818 Other symptoms and signs involving the nervous system: Secondary | ICD-10-CM | POA: Diagnosis not present

## 2022-02-24 NOTE — Progress Notes (Signed)
Nutrition Assessment for Bariatric Surgery Medical Nutrition Therapy Appt Start Time: 2:00    End Time: 3:05  Patient was seen on 02/24/2022 for Pre-Operative Nutrition Assessment. Letter of approval faxed to [facility] bariatric surgery program coordinator on 02/24/2022.   Referral stated Supervised Weight Loss (SWL) visits needed: 12  Planned surgery: Sleeve Gastrectomy Pt expectation of surgery: get pressure off her back; would like to be 89   NUTRITION ASSESSMENT   Anthropometrics  Start weight at [facility]: 420.5 lbs (date: 02/24/2022)  Height: 73 in BMI: 55.48 kg/m2     Clinical  Medical hx: Obesity, sleep apnea, arthritis Medications: Methocarbamol, vitamin B12, Ibuprofen  Labs: no recent labs in EMR Notable signs/symptoms: none noted Any previous deficiencies? No  Micronutrient Nutrition Focused Physical Exam: Hair: No issues observed Eyes: No issues observed Mouth: No issues observed Neck: No issues observed Nails: No issues observed Skin: No issues observed  Lifestyle & Dietary Hx  Patient states she lives with 3 of her children. The pt performs the food shopping and the pt prepares the meals. She reports that she typically skips or misses 7 out of 21 possible meals per week. She may have 4 meals per week that are take-out or at a restaurant. 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 She states she has binge eaten before and has felt shame and/or guilt after eating too much food.  She denies having used laxatives or vomiting to facilitate weight loss. Patient states she has not sought emotional/psychological support for eating issues. She admits to emotional eating during times of stress. She states that she knows the difference between hunger and thirst and can tell when she is full. Pt states she she want to lose wt to help releave stress on her back, so she does not need pain meds.  Pt states she can not sitt or stand fro too long. Pt states  she fell in the winter (Jan/Feb) tripping over her dog. Pt states she is a single mother with 4 children, and one is diabetic.  Pt states the diabetic supplies are expensive. Pt states she was fit in high school, stating she played basketball and was on the cheer squad. Pt states she is waiting on her C-PAP machine, stating she was recently diagnosed with sleep apnea. Pt states she eats when she is stressed or depressed, stating she might stay in her room/bed for a couple of days and eats too much. Pt states she has been intermittent fasting for the past 3 weeks.  Pt states when she is doing intermittent fasting, she only eats one meal a day, which would be a salad with chicken and/or fruit. Pt states she drinks about 10 (16.9 oz) bottles of water  24-Hr Dietary Recall First Meal: water, orange juice, skip Snack:  Second Meal: salad Snack: chips Third Meal: chicken and salad or wonton soup from [employer] or meat with veggies. Snack:  Beverages: water, orange juice, coffee   Estimated Energy Needs Calories: 1600   NUTRITION DIAGNOSIS  Overweight/obesity ([place]-3.3) related to past poor dietary habits and physical inactivity as evidenced by patient w/ planned sleeve gastrectomy surgery following dietary guidelines for continued weight loss.    NUTRITION INTERVENTION  Nutrition counseling (C-1) and education (E-2) to facilitate bariatric surgery goals.  Educated pt on micronutrient deficiencies post surgery and strategies to mitigate that risk   Pre-Op Goals Reviewed with the Patient Track food and beverage intake (pen and paper, MyFitness Pal, Baritastic app, etc.) Make  healthy food choices while monitoring portion sizes Consume 3 meals per day or try to eat every 3-5 hours Avoid concentrated sugars and fried foods Keep sugar & fat in the single digits per serving on food labels Practice CHEWING your food (aim for applesauce consistency) Practice not drinking 15 minutes  before, during, and 30 minutes after each meal and snack Avoid all carbonated beverages (ex: soda, sparkling beverages)  Limit caffeinated beverages (ex: coffee, tea, energy drinks) Avoid all sugar-sweetened beverages (ex: regular soda, sports drinks)  Avoid alcohol  Aim for 64-100 ounces of FLUID daily (with at least half of fluid intake being plain water)  Aim for at least 60-80 grams of PROTEIN daily Look for a liquid protein source that contains ?15 g protein and ?5 g carbohydrate (ex: shakes, drinks, shots) Make a list of non-food related activities Physical activity is an important part of a healthy lifestyle so keep it moving! The goal is to reach 150 minutes of exercise per week, including cardiovascular and weight baring activity.  *Goals that are bolded indicate the pt would like to start working towards these  Handouts Provided Include  Bariatric Surgery handouts (Nutrition Visits, Pre-Op Goals, Protein Shakes, Vitamins & Minerals)  Learning Style & Readiness for Change Teaching method utilized: Visual & Auditory  Demonstrated degree of understanding via: Teach Back  Readiness Level: preparation Barriers to learning/adherence to lifestyle change: none identified  RD's Notes for Next Visit Progress toward patient's chosen goals     MONITORING & EVALUATION Dietary intake, weekly physical activity, body weight, and pre-op goals reached at next nutrition visit.    Next Steps  Patient is to follow up at [facility] for Pre-Op Class >2 weeks before surgery for further nutrition education.

## 2022-03-10 ENCOUNTER — Ambulatory Visit: Payer: 59 | Admitting: Dietician

## 2022-03-16 ENCOUNTER — Encounter: Payer: 59 | Attending: Surgery | Admitting: Dietician

## 2022-03-16 ENCOUNTER — Encounter: Payer: Self-pay | Admitting: Dietician

## 2022-03-16 DIAGNOSIS — E669 Obesity, unspecified: Secondary | ICD-10-CM | POA: Insufficient documentation

## 2022-03-16 NOTE — Progress Notes (Signed)
Supervised Weight Loss Visit Bariatric Nutrition Education  Planned surgery: Sleeve Gastrectomy Pt expectation of surgery: get pressure off her back; would like to be 220  1 out of 12 SWL Appointments   NUTRITION ASSESSMENT  Anthropometrics  Start weight at [facility]: 420.5 lbs (date: 02/24/2022)  Height: 73 in Weight today: 415.1 lbs. BMI: 54.77 kg/m2     Clinical  Medical hx: Obesity, sleep apnea, arthritis Medications: Methocarbamol, vitamin B12, Ibuprofen  Labs: no recent labs in EMR Notable signs/symptoms: none noted Any previous deficiencies? No  Lifestyle & Dietary Hx  Pt state she is eating throughout the days, stating she has been eating 5 meals a day. Pt states she has been walking the park twice every other day.  Pt states she is vaping, but trying to cut back, stating she is planning to quit by the end of this month, being quit by September 1st 2023. Pt sates she got a divided plate for portion control. Pt states she drinks alcohol occasionally. Pt states she has been reading food labels and keeping sugar and fat grams in the single digits. Pt wants to focus more on physical activity. Pt states she reaching her personal goal of not eating after 7 pm.  Estimated daily fluid intake: well over 64 oz (some times 10 - 16 oz bottles) Supplements: B12 Current average weekly physical activity: walking the park twice, every other day.  24-Hr Dietary Recall First Meal: water, less coffee (every other day) egg, sausage or oatmeal with fruit Snack: fruit Second Meal: salad with tun Snack: fruit Third Meal: vegetables and meat Snack: fruit or nuts Beverages: water, coffee  Estimated Energy Needs Calories: 1800   NUTRITION DIAGNOSIS  Overweight/obesity ([place]-3.3) related to past poor dietary habits and physical inactivity as evidenced by patient w/ planned Sleeve Gastrectomy surgery following dietary guidelines for continued weight loss.   NUTRITION INTERVENTION   Nutrition counseling (C-1) and education (E-2) to facilitate bariatric surgery goals.  Pre-Op Goals Progress & New Goals New: Quit vaping New: Increase physical activity (three laps around the park instead of two) Continue: eating throughout the day Continue: portion control  Handouts Provided Include    Learning Style & Readiness for Change Teaching method utilized: Visual & Auditory  Demonstrated degree of understanding via: Teach Back  Readiness Level: preparation Barriers to learning/adherence to lifestyle change: none identified  RD's Notes for next Visit  Progress toward patient's chosen goals   MONITORING & EVALUATION Dietary intake, weekly physical activity, body weight, and pre-op goals in 1 month.   Next Steps  Patient is to return to [facility] in 2 weeks for next SWL visit.

## 2022-03-26 ENCOUNTER — Ambulatory Visit: Payer: 59 | Admitting: Dietician

## 2022-04-07 ENCOUNTER — Ambulatory Visit: Payer: 59 | Admitting: Dietician

## 2022-04-09 ENCOUNTER — Ambulatory Visit: Payer: 59 | Admitting: Dietician

## 2022-04-14 ENCOUNTER — Ambulatory Visit: Payer: 59 | Admitting: Dietician

## 2022-04-17 DIAGNOSIS — N898 Other specified noninflammatory disorders of vagina: Secondary | ICD-10-CM | POA: Diagnosis not present

## 2022-04-17 DIAGNOSIS — Z7251 High risk heterosexual behavior: Secondary | ICD-10-CM | POA: Diagnosis not present

## 2022-04-17 DIAGNOSIS — N390 Urinary tract infection, site not specified: Secondary | ICD-10-CM | POA: Diagnosis not present

## 2022-04-17 DIAGNOSIS — H0014 Chalazion left upper eyelid: Secondary | ICD-10-CM | POA: Diagnosis not present

## 2022-04-17 DIAGNOSIS — R69 Illness, unspecified: Secondary | ICD-10-CM | POA: Diagnosis not present

## 2022-04-20 ENCOUNTER — Ambulatory Visit: Payer: 59 | Admitting: Dietician

## 2022-05-05 ENCOUNTER — Encounter: Payer: Self-pay | Admitting: Dietician

## 2022-05-05 ENCOUNTER — Encounter: Payer: 59 | Attending: Nurse Practitioner | Admitting: Dietician

## 2022-05-05 DIAGNOSIS — E669 Obesity, unspecified: Secondary | ICD-10-CM | POA: Insufficient documentation

## 2022-05-05 NOTE — Progress Notes (Signed)
Supervised Weight Loss Visit Bariatric Nutrition Education  Planned surgery: Sleeve Gastrectomy Pt expectation of surgery: get pressure off her back; would like to be 220  2 out of 12 SWL Appointments   NUTRITION ASSESSMENT  Anthropometrics  Start weight at [facility]: 420.5 lbs (date: 02/24/2022)  Height: 73 in Weight today: 422.6 lbs. BMI: 55.76 kg/m2     Clinical  Medical hx: Obesity, sleep apnea, arthritis Medications: Methocarbamol, vitamin B12, Ibuprofen  Labs: no recent labs in EMR; (12/21/2019) Vit D 13.1; A1C 6.1; Triglycerides 175; HDL 29 Notable signs/symptoms: none noted Any previous deficiencies? No  Lifestyle & Dietary Hx  Pt states she had COVID and then had bronchitis and sinus infection, stating both were visits to the ED. Pt states her CPAP came in and she is having trouble sleeping with it. Pt states she started walking more until her back started hurting.  Pt states with the prescription strength ibuprofen, it was better, stating her prescription ran out. Pt states she has not vaped since she got sick 11 days ago. Pt states she is eating 3 meals a day. Pt states she is scheduled to get labs this month on the 19th. Pt states she cut back on orange juice.  Estimated daily fluid intake: well over 64 oz (some times 10 - 16 oz bottles) Supplements: B12; Vit D Current average weekly physical activity: walking the park twice, every other day.  24-Hr Dietary Recall First Meal: water, less coffee (every other day) egg, sausage or oatmeal with fruit or toast Snack:  Second Meal: salad with tuna, sandwich without bread Snack:  Third Meal: vegetables and meat Snack:  Beverages: water, coffee  Estimated Energy Needs Calories: 1800   NUTRITION DIAGNOSIS  Overweight/obesity ([place]-3.3) related to past poor dietary habits and physical inactivity as evidenced by patient w/ planned Sleeve Gastrectomy surgery following dietary guidelines for continued weight  loss.   NUTRITION INTERVENTION  Nutrition counseling (C-1) and education (E-2) to facilitate bariatric surgery goals.  Why you need complex carbohydrates: Whole grains and other complex carbohydrates are required to have a healthy diet. Whole grains provide fiber which can help with blood glucose levels and help keep you satiated. Fruits and starchy vegetables provide essential vitamins and minerals required for immune function, eyesight support, brain support, bone density, wound healing and many other functions within the body. According to the current evidenced based 2020-2025 Dietary Guidelines for Americans, complex carbohydrates are part of a healthy eating pattern which is associated with a decreased risk for type 2 diabetes, cancers, and cardiovascular disease.   Pre-Op Goals Progress & New Goals Continue: No vaping Continue: Increase physical activity (three laps around the park instead of two) Continue: eating throughout the day Continue: portion control New: smaller portions with meals to encourage snacks in between New: create meals with complex carbohydrates from meal ideas handout  Handouts Provided Include  Meal Ideas Handout  Learning Style & Readiness for Change Teaching method utilized: Visual & Auditory  Demonstrated degree of understanding via: Teach Back  Readiness Level: preparation Barriers to learning/adherence to lifestyle change: none identified  RD's Notes for next Visit  Progress toward patient's chosen goals   MONITORING & EVALUATION Dietary intake, weekly physical activity, body weight, and pre-op goals in 1 month.   Next Steps  Patient is to return to [facility] in 2 weeks for next SWL visit.

## 2022-05-19 ENCOUNTER — Encounter: Payer: 59 | Admitting: Dietician

## 2022-05-21 ENCOUNTER — Encounter: Payer: 59 | Admitting: Dietician

## 2022-05-21 ENCOUNTER — Encounter: Payer: Self-pay | Admitting: Dietician

## 2022-05-21 DIAGNOSIS — E669 Obesity, unspecified: Secondary | ICD-10-CM | POA: Diagnosis not present

## 2022-05-21 NOTE — Progress Notes (Signed)
Supervised Weight Loss Visit Bariatric Nutrition Education  I connected with pt virtually and verified that I am speaking with the correct person using two identifiers.I discussed the limitations of a this type of visit. The patient expressed understanding and agreed to proceed.  Planned surgery: Sleeve Gastrectomy Pt expectation of surgery: get pressure off her back; would like to be 220  3 out of 12 SWL Appointments   NUTRITION ASSESSMENT  Anthropometrics  Start weight at [facility]: 420.5 lbs (date: 02/24/2022)  Height: 73 in Weight today:  lbs. BMI: 55.76 kg/m2     Clinical  Medical hx: Obesity, sleep apnea, arthritis Medications: Methocarbamol, vitamin B12, Ibuprofen  Labs: no recent labs in EMR; (12/21/2019) Vit D 13.1; A1C 6.1; Triglycerides 175; HDL 29 Notable signs/symptoms: none noted Any previous deficiencies? No  Lifestyle & Dietary Hx  Pt states she has an appointment with PCP this Friday. Pt states she does not vape any more, stating she had a craving last week, but states she had some gum to avoid vaping. Pt states she is trying not to eat after 7 pm. Pt states she cut back on OJ and started cranberry juice with no sugar. Pt states she is drinking a couple of bottles of water with dinner.  Dietitian advised that she sip between meals to practice post op habits. Pt agreeable.  Estimated daily fluid intake: well over 64 oz (some times 10 - 16 oz bottles) Supplements: B12; Vit D Current average weekly physical activity: walking the park twice, every other day.  24-Hr Dietary Recall First Meal: water, less coffee (every other day) egg, sausage or oatmeal with fruit Snack:  Second Meal: tossed salad or salad with tuna, sandwich without bread Snack: crackers/nabs or fruit Third Meal: two vegetables and a meat (mostly baked chicken) or steak and baked potato with a little cheese sprinkled on top. Snack:  Beverages: water, cranberry juice with zero sugar,  coffee  Estimated Energy Needs Calories: 1800   NUTRITION DIAGNOSIS  Overweight/obesity ([place]-3.3) related to past poor dietary habits and physical inactivity as evidenced by patient w/ planned Sleeve Gastrectomy surgery following dietary guidelines for continued weight loss.   NUTRITION INTERVENTION  Nutrition counseling (C-1) and education (E-2) to facilitate bariatric surgery goals.  Reiterated: Why you need complex carbohydrates: Whole grains and other complex carbohydrates are required to have a healthy diet. Whole grains provide fiber which can help with blood glucose levels and help keep you satiated. Fruits and starchy vegetables provide essential vitamins and minerals required for immune function, eyesight support, brain support, bone density, wound healing and many other functions within the body. According to the current evidenced based 2020-2025 Dietary Guidelines for Americans, complex carbohydrates are part of a healthy eating pattern which is associated with a decreased risk for type 2 diabetes, cancers, and cardiovascular disease.   Pre-Op Goals Progress & New Goals Continue: No vaping Continue: Increase physical activity (three laps around the park instead of two) Continue: eating throughout the day Continue: portion control Continue: smaller portions with meals to encourage snacks in between Continue: create meals with complex carbohydrates from meal ideas handout New: Practice not drinking with meals, sipping between meal.  Handouts Provided Include  Meal Ideas Handout  Learning Style & Readiness for Change Teaching method utilized: Visual & Auditory  Demonstrated degree of understanding via: Teach Back  Readiness Level: preparation Barriers to learning/adherence to lifestyle change: none identified  RD's Notes for next Visit  Progress toward patient's chosen goals New labs should be  available Physical Activity   MONITORING & EVALUATION Dietary intake, weekly  physical activity, body weight, and pre-op goals in 1 month.   Next Steps  Patient is to return to [facility] in 2 weeks for next SWL visit.

## 2022-06-04 ENCOUNTER — Encounter: Payer: Self-pay | Admitting: Dietician

## 2022-06-04 ENCOUNTER — Encounter: Payer: 59 | Attending: Nurse Practitioner | Admitting: Dietician

## 2022-06-04 DIAGNOSIS — E669 Obesity, unspecified: Secondary | ICD-10-CM | POA: Insufficient documentation

## 2022-06-04 NOTE — Progress Notes (Signed)
Supervised Weight Loss Visit Bariatric Nutrition Education  I connected with Phyllis Daniels virtually and verified that I am speaking with the correct person using two identifiers.I discussed the limitations of a this type of visit. The patient expressed understanding and agreed to proceed.  Planned surgery: Sleeve Gastrectomy Phyllis Daniels expectation of surgery: get pressure off her back; would like to be 220  4 out of 12 SWL Appointments   NUTRITION ASSESSMENT  Anthropometrics  Start weight at [facility]: 420.5 lbs (date: 02/24/2022)  Height: 73 in Weight today:  lbs. (Virtual appointment) BMI:  kg/m2     Clinical  Medical hx: Obesity, sleep apnea, arthritis Medications: Methocarbamol, vitamin B12, Ibuprofen  Labs: no recent labs in EMR; (12/21/2019) Vit D 13.1; A1C 6.1; Triglycerides 175; HDL 29 Notable signs/symptoms: none noted Any previous deficiencies? No  Lifestyle & Dietary Hx  Phyllis Daniels kept cutting in and out with virtual appointment.  Phyllis Daniels states she is walking every other day. Phyllis Daniels states she is still not vaping. Phyllis Daniels states her goal of not drinking with meals is going well. Phyllis Daniels states she is not drinking OJ and cranberry juice. Phyllis Daniels states she walks for 8 minutes, stating she is trying to push herself to 10 minutes.   Estimated daily fluid intake: well over 64 oz (some times 10 - 16 oz bottles) Supplements: B12; Vit D Current average weekly physical activity: walking the park twice, every other day.  24-Hr Dietary Recall First Meal: water, less coffee (every other day) egg, sausage or oatmeal with fruit Snack:  Second Meal: tossed salad or salad with tuna, sandwich without bread or chicken nuggets. Snack: pecans/nuts or fruit Third Meal: two vegetables and a meat (mostly baked chicken) or steak and baked potato with a little cheese sprinkled on top or hamburger patty. Snack:  Beverages: water, cranberry juice with zero sugar, coffee  Estimated Energy Needs Calories: 1800   NUTRITION DIAGNOSIS   Overweight/obesity ([place]-3.3) related to past poor dietary habits and physical inactivity as evidenced by patient w/ planned Sleeve Gastrectomy surgery following dietary guidelines for continued weight loss.   NUTRITION INTERVENTION  Nutrition counseling (C-1) and education (E-2) to facilitate bariatric surgery goals.  Reiterated: Why you need complex carbohydrates: Whole grains and other complex carbohydrates are required to have a healthy diet. Whole grains provide fiber which can help with blood glucose levels and help keep you satiated. Fruits and starchy vegetables provide essential vitamins and minerals required for immune function, eyesight support, brain support, bone density, wound healing and many other functions within the body. According to the current evidenced based 2020-2025 Dietary Guidelines for Americans, complex carbohydrates are part of a healthy eating pattern which is associated with a decreased risk for type 2 diabetes, cancers, and cardiovascular disease.   Pre-Op Goals Progress & New Goals Continue: No vaping Continue: eating throughout the day Continue: portion control Continue: smaller portions with meals to encourage snacks in between Continue: create meals with complex carbohydrates from meal ideas handout Continue: Practice not drinking with meals, sipping between meal. Re-engage/Continue: Increase physical activity (three laps around the park instead of two)  Handouts Provided Include    Learning Style & Readiness for Change Teaching method utilized: Visual & Auditory  Demonstrated degree of understanding via: Teach Back  Readiness Level: preparation Barriers to learning/adherence to lifestyle change: none identified  RD's Notes for next Visit  Progress toward patient's chosen goals New labs should be available Physical Activity   MONITORING & EVALUATION Dietary intake, weekly physical activity, body weight, and  pre-op goals in 1 month.   Next Steps   Patient is to return to [facility] in 2 weeks for next SWL visit.

## 2022-06-18 ENCOUNTER — Encounter: Payer: Self-pay | Admitting: Dietician

## 2022-06-18 ENCOUNTER — Encounter: Payer: 59 | Attending: Surgery | Admitting: Dietician

## 2022-06-18 VITALS — Ht 73.0 in | Wt >= 6400 oz

## 2022-06-18 DIAGNOSIS — E669 Obesity, unspecified: Secondary | ICD-10-CM | POA: Diagnosis not present

## 2022-06-18 NOTE — Progress Notes (Signed)
Supervised Weight Loss Visit Bariatric Nutrition Education  Planned surgery: Sleeve Gastrectomy Pt expectation of surgery: get pressure off her back; would like to be 220  5 out of 12 SWL Appointments   NUTRITION ASSESSMENT  Anthropometrics  Start weight at [facility]: 420.5 lbs (date: 02/24/2022)  Height: 73 in Weight today: 414.4 lbs. BMI: 54.67 kg/m2     Clinical  Medical hx: Obesity, sleep apnea, arthritis Medications: Methocarbamol, vitamin B12, Ibuprofen  Labs: no recent labs in EMR; (12/21/2019) Vit D 13.1; A1C 6.1; Triglycerides 175; HDL 29 Notable signs/symptoms: none noted Any previous deficiencies? No  Lifestyle & Dietary Hx  Pt states she and her daughter went to the ED sick with congestion. Pt states her daughter has type 1 diabetes. Pt states she is eating small five meals a day. Pt states she is walking 10 minutes a day, stating she will try for 15 minutes before the next visit. Pt states she wants to continue to increase her time/distance. Pt state she is not smoking cigarettes and not vaping, stating she has not relapsed. Pt states she is eating squash 3 meals a week.  Pt states she is eating nuts and a whole grain cracker for snacks. Pt states she has not had fried foods for about 3 weeks.  Estimated daily fluid intake: well over 64 oz (some times 10 - 16 oz bottles) Supplements: B12; Vit D Current average weekly physical activity: walking the park twice, every other day (Tuesday, Thursday, Saturday).  24-Hr Dietary Recall First Meal: water, less coffee (every other day) egg, sausage or oatmeal with fruit or wheat toast Snack: fruit or nuts Second Meal: tossed salad or salad with tuna, sandwich without bread or chicken nuggets or wrap or lettuce wrap burger. Snack: pecans/nuts or fruit Third Meal: two vegetables and a meat (mostly baked chicken) or meatloaf and baked potato or baked fish with tossed salad. Snack:  Beverages: water, cranberry juice with zero  sugar, coffee  Estimated Energy Needs Calories: 1800   NUTRITION DIAGNOSIS  Overweight/obesity ([place]-3.3) related to past poor dietary habits and physical inactivity as evidenced by patient w/ planned Sleeve Gastrectomy surgery following dietary guidelines for continued weight loss.   NUTRITION INTERVENTION  Nutrition counseling (C-1) and education (E-2) to facilitate bariatric surgery goals.  The importance of daily physical activity and to reach a goal of at least 150 minutes of moderate to vigorous physical activity weekly (or as directed by their physician) due to benefits such as increased musculature and improved lab values Encouraged pt to continue to eat balanced meals inclusive of non starchy vegetables 2 times a day 7 days a week Encouraged pt to choose lean protein sources: limiting beef, pork, sausage, hotdogs, and lunch meat Encourage pt to choose healthy fats such as plant based limiting animal fats Encouraged pt to continue to drink a minium 64 fluid ounces with half being plain water to satisfy proper hydration   Pre-Op Goals Progress & New Goals Continue: No vaping Continue: eating throughout the day Continue: portion control Continue: smaller portions with meals to encourage snacks in between Continue: create meals with complex carbohydrates from meal ideas handout Continue: Practice not drinking with meals, sipping between meal. Re-engage/Continue: Increase physical activity (15 minutes at a time instead of 10)  Handouts Provided Include    Learning Style & Readiness for Change Teaching method utilized: Visual & Auditory  Demonstrated degree of understanding via: Teach Back  Readiness Level: preparation Barriers to learning/adherence to lifestyle change: none identified  RD's  Notes for next Visit  Progress toward patient's chosen goals New labs should be available Physical Activity   MONITORING & EVALUATION Dietary intake, weekly physical activity, body  weight, and pre-op goals in 1 month.   Next Steps  Patient is to return to [facility] in 2 weeks for next SWL visit.

## 2022-06-23 DIAGNOSIS — R69 Illness, unspecified: Secondary | ICD-10-CM | POA: Diagnosis not present

## 2022-06-23 DIAGNOSIS — F411 Generalized anxiety disorder: Secondary | ICD-10-CM | POA: Diagnosis not present

## 2022-06-29 DIAGNOSIS — R69 Illness, unspecified: Secondary | ICD-10-CM | POA: Diagnosis not present

## 2022-06-29 DIAGNOSIS — F411 Generalized anxiety disorder: Secondary | ICD-10-CM | POA: Diagnosis not present

## 2022-07-06 ENCOUNTER — Ambulatory Visit: Payer: 59 | Admitting: Dietician

## 2022-07-07 ENCOUNTER — Other Ambulatory Visit (HOSPITAL_COMMUNITY): Payer: Self-pay | Admitting: Internal Medicine

## 2022-07-07 ENCOUNTER — Encounter: Payer: 59 | Admitting: Dietician

## 2022-07-07 DIAGNOSIS — Z111 Encounter for screening for respiratory tuberculosis: Secondary | ICD-10-CM

## 2022-07-09 ENCOUNTER — Encounter: Payer: 59 | Attending: Nurse Practitioner | Admitting: Dietician

## 2022-07-09 ENCOUNTER — Encounter: Payer: Self-pay | Admitting: Dietician

## 2022-07-09 VITALS — Ht 73.0 in | Wt >= 6400 oz

## 2022-07-09 DIAGNOSIS — E669 Obesity, unspecified: Secondary | ICD-10-CM | POA: Diagnosis present

## 2022-07-09 NOTE — Progress Notes (Signed)
Supervised Weight Loss Visit Bariatric Nutrition Education  Planned surgery: Sleeve Gastrectomy Pt expectation of surgery: get pressure off her back; would like to be 220  6 out of 12 SWL Appointments   NUTRITION ASSESSMENT  Anthropometrics  Start weight at [facility]: 420.5 lbs (date: 02/24/2022)  Height: 73 in Weight today: 415.5 lbs. BMI: 54.82 kg/m2     Clinical  Medical hx: Obesity, sleep apnea, arthritis Medications: Methocarbamol, vitamin B12, Ibuprofen  Labs: no recent labs in EMR; (12/21/2019) Vit D 13.1; A1C 6.1; Triglycerides 175; HDL 29 Notable signs/symptoms: none noted Any previous deficiencies? No  Lifestyle & Dietary Hx  Pt states she is feeling sick, stating she had COVID, but over it now. Pt states she will have labs drawn at her next Dr appointment, Dec 14th. Pt states she was able to increase to 15 minutes of physical activity, stating she has not been doing much since being sick. Pt states she tried some complex carbohydrates (corn and peas), stating to make healthier choices. Pt proud to report no sugary juices (OJ) Pt states she has been doing well cutting back on chips. Pt states she is agreeable to resuming her physical activity.  Estimated daily fluid intake: 64+ oz (some times 10 - 16 oz bottles) Supplements: B12; Vit D Current average weekly physical activity: walking the park twice (15 minutes), every other day (Tuesday, Thursday, Saturday).  24-Hr Dietary Recall First Meal: water, less coffee (every other day) egg, sausage or oatmeal with fruit or wheat toast or cereal with banana or apple. Snack: fruit or nuts Second Meal: tossed salad or salad with tuna, sandwich without bread or chicken nuggets or wrap or lettuce wrap burger. Snack: pecans/nuts or fruit Third Meal: two vegetables (cabbage or broccoli or squash or carrots) and a meat (mostly baked chicken) or roast or meatloaf and baked potato or baked fish with tossed salad or hot dog with chips or  soup Snack:  Beverages: water, cranberry juice with zero sugar, coffee  Estimated Energy Needs Calories: 1800   NUTRITION DIAGNOSIS  Overweight/obesity ([place]-3.3) related to past poor dietary habits and physical inactivity as evidenced by patient w/ planned Sleeve Gastrectomy surgery following dietary guidelines for continued weight loss.   NUTRITION INTERVENTION  Nutrition counseling (C-1) and education (E-2) to facilitate bariatric surgery goals.  The importance of daily physical activity and to reach a goal of at least 150 minutes of moderate to vigorous physical activity weekly (or as directed by their physician) due to benefits such as increased musculature and improved lab values Encouraged pt to continue to eat balanced meals inclusive of non starchy vegetables 2 times a day 7 days a week Encouraged pt to choose lean protein sources: limiting beef, pork, sausage, hotdogs, and lunch meat Encourage pt to choose healthy fats such as plant based limiting animal fats Encouraged pt to continue to drink a minium 64 fluid ounces with half being plain water to satisfy proper hydration   Pre-Op Goals Progress & New Goals Continue: No vaping Continue: eating throughout the day Continue: portion control Continue: smaller portions with meals to encourage snacks in between Continue: create meals with complex carbohydrates from meal ideas handout Continue: Practice not drinking with meals, sipping between meal. Re-engage/Continue: Increase physical activity (15 minutes at a time) New: Variety of non-starchy vegetables and fruits  Handouts Provided Include  Serving sizes for fruits and vegetables  Learning Style & Readiness for Change Teaching method utilized: Visual & Auditory  Demonstrated degree of understanding via: Teach Back  Readiness Level: preparation Barriers to learning/adherence to lifestyle change: none identified  RD's Notes for next Visit  Progress toward patient's chosen  goals New labs should be available Physical Activity   MONITORING & EVALUATION Dietary intake, weekly physical activity, body weight, and pre-op goals in 1 month.   Next Steps  Patient is to return to [facility] in 2 weeks for next SWL visit.

## 2022-07-14 ENCOUNTER — Ambulatory Visit (HOSPITAL_COMMUNITY)
Admission: RE | Admit: 2022-07-14 | Discharge: 2022-07-14 | Disposition: A | Discharge status: Home / Self Care | Payer: 59 | Source: Ambulatory Visit | Attending: Internal Medicine | Admitting: Internal Medicine

## 2022-07-14 DIAGNOSIS — Z111 Encounter for screening for respiratory tuberculosis: Secondary | ICD-10-CM | POA: Diagnosis not present

## 2022-07-20 DIAGNOSIS — Z0000 Encounter for general adult medical examination without abnormal findings: Secondary | ICD-10-CM | POA: Diagnosis not present

## 2022-07-20 DIAGNOSIS — E559 Vitamin D deficiency, unspecified: Secondary | ICD-10-CM | POA: Diagnosis not present

## 2022-07-20 DIAGNOSIS — Z7189 Other specified counseling: Secondary | ICD-10-CM | POA: Diagnosis not present

## 2022-07-20 DIAGNOSIS — E538 Deficiency of other specified B group vitamins: Secondary | ICD-10-CM | POA: Diagnosis not present

## 2022-07-23 ENCOUNTER — Encounter: Payer: 59 | Attending: Nurse Practitioner | Admitting: Dietician

## 2022-07-23 ENCOUNTER — Encounter: Payer: Self-pay | Admitting: Dietician

## 2022-07-23 DIAGNOSIS — E669 Obesity, unspecified: Secondary | ICD-10-CM | POA: Insufficient documentation

## 2022-07-23 NOTE — Progress Notes (Signed)
Supervised Weight Loss Visit Bariatric Nutrition Education  Planned surgery: Sleeve Gastrectomy Pt expectation of surgery: get pressure off her back; would like to be 220  7 out of 12 SWL Appointments   Virtual Visit I connected with Phyllis Daniels on 07/23/22 at  8:00 AM EST by a video enabled application and verified that I am speaking with the correct person using two identifiers. I discussed the limitations of the visit. The patient expressed understanding and agreed to proceed.  NUTRITION ASSESSMENT  Anthropometrics  Start weight at [facility]: 420.5 lbs (date: 02/24/2022)  Height: 73 in Weight today: 415.5 lbs. BMI: 54.82 kg/m2     Clinical  Medical hx: Obesity, sleep apnea, arthritis Medications: Methocarbamol, vitamin B12, Ibuprofen, Vit D Labs: no recent labs in EMR; (12/21/2019) Vit D 13.1; A1C 6.1; Triglycerides 175; HDL 29 Notable signs/symptoms: none noted Any previous deficiencies? No  Lifestyle & Dietary Hx  Pt states her doctor gave her a prescription for Vit D, stating labs came back and PCP will be mailing to patient. 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 Pt states she went to the emergency dept. Pt states she tries practicing not drinking while she eats, stating she has come a long way. Pt states she is trying to eat more vegetables, stating she is trying things that she doesn't always have.  Pt states she ate a variety of fruit. Pt states she works at [employer]. Pt states her new insurance will kick in the first of the year, stating she will be in contact with [employer] to confirm coverage and terms.   Estimated daily fluid intake: 64+ oz (some times 10 - 16 oz bottles) Supplements: B12; Vit D Current average weekly physical activity: this past week ADL due to pain... usually: walking the park twice (15 minutes), every other day (Tuesday, Thursday, Saturday).  24-Hr  Dietary Recall First Meal: water, less coffee (every other day) egg, sausage/bacon or oatmeal with fruit. Snack: fruit or nuts Second Meal: tossed salad or salad with tuna, sandwich without bread or chicken nuggets or wrap or lettuce wrap burger or chicken salad or [place] salad Snack: pecans/nuts or fruit or gluten free cracker Third Meal: two vegetables (cabbage or broccoli or squash or carrots) and a meat (mostly baked chicken) or roast or meatloaf and baked potato or baked fish with tossed salad Snack:  Beverages: water, juice flavored drink with zero sugar, coffee, 4 oz of Pepsi one day  Estimated Energy Needs Calories: 1800   NUTRITION DIAGNOSIS  Overweight/obesity ([place]-3.3) related to past poor dietary habits and physical inactivity as evidenced by patient w/ planned Sleeve Gastrectomy surgery following dietary guidelines for continued weight loss.   NUTRITION INTERVENTION  Nutrition counseling (C-1) and education (E-2) to facilitate bariatric surgery goals.  The importance of daily physical activity and to reach a goal of at least 150 minutes of moderate to vigorous physical activity weekly (or as directed by their physician) due to benefits such as increased musculature and improved lab values Encouraged pt to continue to eat balanced meals inclusive of non starchy vegetables 2 times a day 7 days a week Encouraged pt to choose lean protein sources: limiting beef, pork, sausage, hotdogs, and lunch meat Encourage pt to choose healthy fats such as plant based limiting animal fats Encouraged pt to continue to drink a minium 64 fluid ounces with half being plain water to satisfy proper hydration  Pre-Op Goals Progress & New Goals Continue: No vaping Continue: eating throughout the day Continue: portion control Continue: smaller portions with meals to encourage snacks in between Continue: create meals with complex carbohydrates from meal ideas handout Continue: Practice not drinking  with meals, sipping between meal. Continue: Variety of non-starchy vegetables and fruits Re-engage/Continue: Increase physical activity (15 minutes at a time)... as tolerated, due to sciatic nerve  Handouts Provided Include  Serving sizes for fruits and vegetables  Learning Style & Readiness for Change Teaching method utilized: Visual & Auditory  Demonstrated degree of understanding via: Teach Back  Readiness Level: preparation Barriers to learning/adherence to lifestyle change: none identified  RD's Notes for next Visit  Progress toward patient's chosen goals New labs should be available Physical Activity   MONITORING & EVALUATION Dietary intake, weekly physical activity, body weight, and pre-op goals in 1 month.   Next Steps  Patient is to return to [facility] in 2 weeks for next SWL visit.

## 2022-08-13 ENCOUNTER — Ambulatory Visit: Payer: 59 | Admitting: Dietician

## 2022-08-19 ENCOUNTER — Inpatient Hospital Stay (HOSPITAL_COMMUNITY): Admission: RE | Admit: 2022-08-19 | Payer: Medicaid Other | Source: Ambulatory Visit

## 2022-08-20 ENCOUNTER — Ambulatory Visit: Payer: Self-pay | Admitting: Dietician

## 2022-08-24 ENCOUNTER — Ambulatory Visit (HOSPITAL_COMMUNITY)
Admission: RE | Admit: 2022-08-24 | Discharge: 2022-08-24 | Disposition: A | Discharge status: Home / Self Care | Payer: Commercial Managed Care - HMO | Source: Ambulatory Visit | Attending: Surgery | Admitting: Surgery

## 2022-08-24 ENCOUNTER — Other Ambulatory Visit (HOSPITAL_COMMUNITY): Payer: Self-pay | Admitting: Surgery

## 2022-09-28 ENCOUNTER — Ambulatory Visit: Payer: 59

## 2022-10-05 ENCOUNTER — Encounter: Payer: Commercial Managed Care - HMO | Attending: Surgery | Admitting: Skilled Nursing Facility1

## 2022-10-05 ENCOUNTER — Encounter: Payer: Self-pay | Admitting: Skilled Nursing Facility1

## 2022-10-05 VITALS — Wt >= 6400 oz

## 2022-10-05 DIAGNOSIS — Z9884 Bariatric surgery status: Secondary | ICD-10-CM | POA: Insufficient documentation

## 2022-10-05 DIAGNOSIS — E119 Type 2 diabetes mellitus without complications: Secondary | ICD-10-CM | POA: Insufficient documentation

## 2022-10-05 DIAGNOSIS — Z4889 Encounter for other specified surgical aftercare: Secondary | ICD-10-CM | POA: Diagnosis not present

## 2022-10-05 DIAGNOSIS — Z713 Dietary counseling and surveillance: Secondary | ICD-10-CM | POA: Insufficient documentation

## 2022-10-05 DIAGNOSIS — E669 Obesity, unspecified: Secondary | ICD-10-CM

## 2022-10-05 NOTE — Progress Notes (Signed)
Pre-Operative Nutrition Class:    Patient was seen on 10/05/2022 for Pre-Operative Bariatric Surgery Education [facility]    Surgery date: Surgery type: sleeve gastrectomy  Start weight at [facility]: 420.5 Weight today: 427   The following the learning objectives were met by the patient during this course: Identify Pre-Op Dietary Goals and will begin 2 weeks pre-operatively Identify appropriate sources of fluids and proteins  State protein recommendations and appropriate sources pre and post-operatively Identify Post-Operative Dietary Goals and will follow for 2 weeks post-operatively Identify appropriate multivitamin and calcium sources Describe the need for physical activity post-operatively and will follow MD recommendations State when to call healthcare provider regarding medication questions or post-operative complications When having a diagnosis of diabetes understanding hypoglycemia symptoms and the inclusion of 1 complex carbohydrate per meal  Handouts given during class include: Pre-Op Bariatric Surgery Diet Handout Protein Shake Handout Post-Op Bariatric Surgery Nutrition Handout BELT Program Information Flyer Support Group Information Flyer [facility] Bariatric Supplements Price List  Follow-Up Plan: Patient will follow-up at [facility] 2 weeks post operatively for diet advancement per MD.

## 2022-10-14 ENCOUNTER — Ambulatory Visit: Payer: Self-pay | Admitting: Surgery

## 2022-10-14 NOTE — H&P (Signed)
Chief Complaint:  super obesity BMI 55  History of Present Illness:  Phyllis Daniels is an 42 y.o. female who at 6\' 1"  tall weighs 420 lbs.  She used to play basketball and softball.    She was seen in the office last summer and desired sleeve gastrectomy.  She presents for that operation.    Past Medical History:  Diagnosis Date   Abnormal Pap smear of cervix    Back pain    DDD (degenerative disc disease), lumbar     Past Surgical History:  Procedure Laterality Date   CESAREAN SECTION     TUBAL LIGATION      Current Outpatient Medications  Medication Sig Dispense Refill   DULoxetine (CYMBALTA) 20 MG capsule TAKE 1 CAPSULE(20 MG) BY MOUTH DAILY 30 capsule 0   Cholecalciferol (VITAMIN D) 50 MCG (2000 UT) CAPS Take 1 capsule by mouth daily.     ibuprofen (ADVIL) 400 MG tablet Take by mouth as needed. Every 8 hours     IUD'S IU by Intrauterine route once.     No current facility-administered medications for this visit.   Tuberculin tests and Latex Family History  Problem Relation Age of Onset   Hypertension Mother    Cancer Mother        Breast   Lupus Sister    Heart disease Maternal Grandfather    Heart attack Paternal Grandmother    Diabetes Daughter    Social History:   reports that she has been smoking cigarettes. She has been smoking an average of .5 packs per day. She has never used smokeless tobacco. She reports current alcohol use. She reports that she does not use drugs.   REVIEW OF SYSTEMS : Negative except for see problem list  Physical Exam:   There were no vitals taken for this visit. There is no height or weight on file to calculate BMI.  Gen:  WDWN AAF NAD  Neurological: Alert and oriented to person, place, and time. Motor and sensory function is grossly intact  Head: Normocephalic and atraumatic.  Eyes: Conjunctivae are normal. Pupils are equal, round, and reactive to light. No scleral icterus.  Neck: Normal range of motion. Neck supple. No tracheal  deviation or thyromegaly present.  Cardiovascular:  SR without murmurs or gallops.  No carotid bruits Breast:  not examined Respiratory: Effort normal.  No respiratory distress. No chest wall tenderness. Breath sounds normal.  No wheezes, rales or rhonchi.  Abdomen:  obese and nontender GU:  not examined Musculoskeletal: Normal range of motion. Extremities are nontender. No cyanosis, edema or clubbing noted Lymphadenopathy: No cervical, preauricular, postauricular or axillary adenopathy is present Skin: Skin is warm and dry. No rash noted. No diaphoresis. No erythema. No pallor. Pscyh: Normal mood and affect. Behavior is normal. Judgment and thought content normal.   LABORATORY RESULTS: No results found for this or any previous visit (from the past 48 hour(s)).   RADIOLOGY RESULTS: No results found. I have reviewed her UGI and there is only one shot that I see the "small hiatal hernia"  Occasional  GER with pizza-doesn't take any thing.   Problem List: Patient Active Problem List   Diagnosis Date Noted   Anxiety 01/28/2020   Back pain at L4-L5 level 10/10/2015   DDD (degenerative disc disease), lumbosacral 10/10/2015   Radiculopathy of lumbosacral region 10/10/2015    Assessment & Plan: Plan robotic sleeve gastrectomy on March 25.      Matt B. Hassell Done, MD, [facility] [phone] beeper [phone]  10/14/2022 1:58 PM

## 2022-10-14 NOTE — H&P (View-Only) (Signed)
Chief Complaint:  super obesity BMI 55  History of Present Illness:  Phyllis Daniels is an 42 y.o. female who at 6\' 1"  tall weighs 420 lbs.  She used to play basketball and softball.    She was seen in the office last summer and desired sleeve gastrectomy.  She presents for that operation.    Past Medical History:  Diagnosis Date   Abnormal Pap smear of cervix    Back pain    DDD (degenerative disc disease), lumbar     Past Surgical History:  Procedure Laterality Date   CESAREAN SECTION     TUBAL LIGATION      Current Outpatient Medications  Medication Sig Dispense Refill   DULoxetine (CYMBALTA) 20 MG capsule TAKE 1 CAPSULE(20 MG) BY MOUTH DAILY 30 capsule 0   Cholecalciferol (VITAMIN D) 50 MCG (2000 UT) CAPS Take 1 capsule by mouth daily.     ibuprofen (ADVIL) 400 MG tablet Take by mouth as needed. Every 8 hours     IUD'S IU by Intrauterine route once.     No current facility-administered medications for this visit.   Tuberculin tests and Latex Family History  Problem Relation Age of Onset   Hypertension Mother    Cancer Mother        Breast   Lupus Sister    Heart disease Maternal Grandfather    Heart attack Paternal Grandmother    Diabetes Daughter    Social History:   reports that she has been smoking cigarettes. She has been smoking an average of .5 packs per day. She has never used smokeless tobacco. She reports current alcohol use. She reports that she does not use drugs.   REVIEW OF SYSTEMS : Negative except for see problem list  Physical Exam:   There were no vitals taken for this visit. There is no height or weight on file to calculate BMI.  Gen:  WDWN AAF NAD  Neurological: Alert and oriented to person, place, and time. Motor and sensory function is grossly intact  Head: Normocephalic and atraumatic.  Eyes: Conjunctivae are normal. Pupils are equal, round, and reactive to light. No scleral icterus.  Neck: Normal range of motion. Neck supple. No tracheal  deviation or thyromegaly present.  Cardiovascular:  SR without murmurs or gallops.  No carotid bruits Breast:  not examined Respiratory: Effort normal.  No respiratory distress. No chest wall tenderness. Breath sounds normal.  No wheezes, rales or rhonchi.  Abdomen:  obese and nontender GU:  not examined Musculoskeletal: Normal range of motion. Extremities are nontender. No cyanosis, edema or clubbing noted Lymphadenopathy: No cervical, preauricular, postauricular or axillary adenopathy is present Skin: Skin is warm and dry. No rash noted. No diaphoresis. No erythema. No pallor. Pscyh: Normal mood and affect. Behavior is normal. Judgment and thought content normal.   LABORATORY RESULTS: No results found for this or any previous visit (from the past 48 hour(s)).   RADIOLOGY RESULTS: No results found. I have reviewed her UGI and there is only one shot that I see the "small hiatal hernia"  Occasional  GER with pizza-doesn't take any thing.   Problem List: Patient Active Problem List   Diagnosis Date Noted   Anxiety 01/28/2020   Back pain at L4-L5 level 10/10/2015   DDD (degenerative disc disease), lumbosacral 10/10/2015   Radiculopathy of lumbosacral region 10/10/2015    Assessment & Plan: Plan robotic sleeve gastrectomy on March 25.      Matt B. Hassell Done, MD, [facility] [phone] beeper [phone]  10/14/2022 1:58 PM

## 2022-10-19 NOTE — Patient Instructions (Signed)
DUE TO COVID-19 ONLY TWO VISITORS  (aged 43 and older)  ARE ALLOWED TO COME WITH YOU AND STAY IN THE WAITING ROOM ONLY DURING PRE OP AND PROCEDURE.   **NO VISITORS ARE ALLOWED IN THE SHORT STAY AREA OR RECOVERY ROOM!!**  IF YOU WILL BE ADMITTED INTO THE HOSPITAL YOU ARE ALLOWED ONLY FOUR SUPPORT PEOPLE DURING VISITATION HOURS ONLY (7 AM -8PM)   The support person(s) must pass our screening, gel in and out, and wear a mask at all times, including in the patient's room. Patients must also wear a mask when staff or their support person are in the room. Visitors GUEST BADGE MUST BE WORN VISIBLY  One adult visitor may remain with you overnight and MUST be in the room by 8 P.M.     Your procedure is scheduled on: 10/26/22   Report to [facility] Main Entrance    Report to admitting at : 5:15 AM   Call this number if you have problems the morning of surgery [phone]   MORNING OF SURGERY DRINK:   DRINK 1 G2 drink BEFORE YOU LEAVE HOME, DRINK ALL OF THE  G2 DRINK AT ONE TIME.   NO SOLID FOOD AFTER 600 PM THE NIGHT BEFORE YOUR SURGERY. YOU MAY DRINK CLEAR FLUIDS. THE G2 DRINK YOU DRINK BEFORE YOU LEAVE HOME (At: 4:30 AM) WILL BE THE LAST FLUIDS YOU DRINK BEFORE SURGERY.  PAIN IS EXPECTED AFTER SURGERY AND WILL NOT BE COMPLETELY ELIMINATED. AMBULATION AND TYLENOL WILL HELP REDUCE INCISIONAL AND GAS PAIN. MOVEMENT IS KEY!  YOU ARE EXPECTED TO BE OUT OF BED WITHIN 4 HOURS OF ADMISSION TO YOUR PATIENT ROOM.  SITTING IN THE RECLINER THROUGHOUT THE DAY IS IMPORTANT FOR DRINKING FLUIDS AND MOVING GAS THROUGHOUT THE GI TRACT.  COMPRESSION STOCKINGS SHOULD BE WORN [place] UNLESS YOU ARE WALKING.   INCENTIVE SPIROMETER SHOULD BE USED EVERY HOUR WHILE AWAKE TO DECREASE POST-OPERATIVE COMPLICATIONS SUCH AS PNEUMONIA.  WHEN DISCHARGED HOME, IT IS IMPORTANT TO CONTINUE TO WALK EVERY HOUR AND USE THE INCENTIVE SPIROMETER EVERY HOUR.   After Midnight you may have the  following liquids until : 4:30 AM DAY OF SURGERY  Water Black Coffee (sugar ok, NO MILK/CREAM OR CREAMERS)  Tea (sugar ok, NO MILK/CREAM OR CREAMERS) regular and decaf                             Plain Jell-O (NO RED)                                           Fruit ices (not with fruit pulp, NO RED)                                     Popsicles (NO RED)                                                                  Juice: apple, WHITE grape, WHITE cranberry Sports drinks like Gatorade (NO RED)     The day of  surgery:  Drink ONE (1) Pre-Surgery Clear: G2 at: 4:30 AM the morning of surgery. Drink in one sitting. Do not sip.  This drink was given to you during your hospital  pre-op appointment visit. Nothing else to drink after completing the  Pre-Surgery Clear Ensure or G2.          If you have questions, please contact your surgeon's office   Oral Hygiene is also important to reduce your risk of infection.                                    Remember - BRUSH YOUR TEETH THE MORNING OF SURGERY WITH YOUR REGULAR TOOTHPASTE  DENTURES WILL BE REMOVED PRIOR TO SURGERY PLEASE DO NOT APPLY "Poly grip" OR ADHESIVES!!!   Do NOT smoke after Midnight   Take these medicines the morning of surgery with A SIP OF WATER: N/  Bring CPAP mask and tubing day of surgery.                              You may not have any metal on your body including hair pins, jewelry, and body piercing             Do not wear make-up, lotions, powders, perfumes/cologne, or deodorant  Do not wear nail polish including gel and S&S, artificial/acrylic nails, or any other type of covering on natural nails including finger and toenails. If you have artificial nails, gel coating, etc. that needs to be removed by a nail salon please have this removed prior to surgery or surgery may need to be canceled/ delayed if the surgeon/ anesthesia feels like they are unable to be safely monitored.   Do not shave  48 hours prior to  surgery.    Do not bring valuables to the hospital. [place].   Contacts, glasses, or bridgework may not be worn into surgery.   Bring small overnight bag day of surgery.   DO NOT [place]. PHARMACY WILL DISPENSE MEDICATIONS LISTED ON YOUR MEDICATION LIST TO YOU DURING YOUR ADMISSION [place]!    Patients discharged on the day of surgery will not be allowed to drive home.  Someone NEEDS to stay with you for the first 24 hours after anesthesia.   Special Instructions: Bring a copy of your healthcare power of attorney and living will documents         the day of surgery if you haven't scanned them before.              Please read over the following fact sheets you were given: IF YOU HAVE QUESTIONS ABOUT YOUR PRE-OP INSTRUCTIONS PLEASE CALL [phone]    [facility] - Preparing for Surgery Before surgery, you can play an important role.  Because skin is not sterile, your skin needs to be as free of germs as possible.  You can reduce the number of germs on your skin by washing with CHG (chlorahexidine gluconate) soap before surgery.  CHG is an antiseptic cleaner which kills germs and bonds with the skin to continue killing germs even after washing. Please DO NOT use if you have an allergy to CHG or antibacterial soaps.  If your skin becomes reddened/irritated stop using  the CHG and inform your nurse when you arrive at Short Stay. Do not shave (including legs and underarms) for at least 48 hours prior to the first CHG shower.  You may shave your face/neck. Please follow these instructions carefully:  1.  Shower with CHG Soap the night before surgery and the  morning of Surgery.  2.  If you choose to wash your hair, wash your hair first as usual with your  normal  shampoo.  3.  After you shampoo, rinse your hair and body thoroughly to remove the  shampoo.                           4.  Use CHG as you would  any other liquid soap.  You can apply chg directly  to the skin and wash                       Gently with a scrungie or clean washcloth.  5.  Apply the CHG Soap to your body ONLY FROM THE NECK DOWN.   Do not use on face/ open                           Wound or open sores. Avoid contact with eyes, ears mouth and genitals (private parts).                       Wash face,  Genitals (private parts) with your normal soap.             6.  Wash thoroughly, paying special attention to the area where your surgery  will be performed.  7.  Thoroughly rinse your body with warm water from the neck down.  8.  DO NOT shower/wash with your normal soap after using and rinsing off  the CHG Soap.                9.  Pat yourself dry with a clean towel.            10.  Wear clean pajamas.            11.  Place clean sheets on your bed the night of your first shower and do not  sleep with pets. Day of Surgery : Do not apply any lotions/deodorants the morning of surgery.  Please wear clean clothes to the hospital/surgery center.  FAILURE TO FOLLOW THESE INSTRUCTIONS MAY RESULT IN THE CANCELLATION OF YOUR SURGERY PATIENT SIGNATURE_________________________________  NURSE SIGNATURE__________________________________  ________________________________________________________________________

## 2022-10-20 ENCOUNTER — Encounter (HOSPITAL_COMMUNITY): Payer: Self-pay

## 2022-10-20 ENCOUNTER — Encounter (HOSPITAL_COMMUNITY)
Admission: RE | Admit: 2022-10-20 | Discharge: 2022-10-20 | Disposition: A | Discharge status: Home / Self Care | Payer: Commercial Managed Care - HMO | Source: Ambulatory Visit | Attending: Surgery | Admitting: Surgery

## 2022-10-20 ENCOUNTER — Other Ambulatory Visit: Payer: Self-pay

## 2022-10-20 VITALS — BP 182/94 | HR 90 | Temp 99.4°F | Ht 73.0 in | Wt >= 6400 oz

## 2022-10-20 DIAGNOSIS — Z01812 Encounter for preprocedural laboratory examination: Secondary | ICD-10-CM | POA: Insufficient documentation

## 2022-10-20 DIAGNOSIS — R7303 Prediabetes: Secondary | ICD-10-CM | POA: Insufficient documentation

## 2022-10-20 DIAGNOSIS — Z6841 Body Mass Index (BMI) 40.0 and over, adult: Secondary | ICD-10-CM | POA: Diagnosis not present

## 2022-10-20 HISTORY — DX: Prediabetes: R73.03

## 2022-10-20 HISTORY — DX: Dyspnea, unspecified: R06.00

## 2022-10-20 HISTORY — DX: Anxiety disorder, unspecified: F41.9

## 2022-10-20 LAB — COMPREHENSIVE METABOLIC PANEL
ALT: 21 U/L (ref 0–44)
AST: 17 U/L (ref 15–41)
Albumin: 4 g/dL (ref 3.5–5.0)
Alkaline Phosphatase: 90 U/L (ref 38–126)
Anion gap: 5 (ref 5–15)
BUN: 12 mg/dL (ref 6–20)
CO2: 26 mmol/L (ref 22–32)
Calcium: 9.2 mg/dL (ref 8.9–10.3)
Chloride: 106 mmol/L (ref 98–111)
Creatinine, Ser: 0.84 mg/dL (ref 0.44–1.00)
GFR, Estimated: >60 mL/min (ref >60)
Glucose, Bld: 92 mg/dL (ref 70–99)
Potassium: 4.3 mmol/L (ref 3.5–5.1)
Sodium: 137 mmol/L (ref 135–145)
Total Bilirubin: 0.5 mg/dL (ref 0.3–1.2)
Total Protein: 7.8 g/dL (ref 6.5–8.1)

## 2022-10-20 LAB — CBC WITH DIFFERENTIAL/PLATELET
Abs Immature Granulocytes: 0.02 10*3/uL (ref 0.00–0.07)
Basophils Absolute: 0 10*3/uL (ref 0.0–0.1)
Basophils Relative: 1 %
Eosinophils Absolute: 0.2 10*3/uL (ref 0.0–0.5)
Eosinophils Relative: 3 %
HCT: 47.4 % — ABNORMAL HIGH (ref 36.0–46.0)
Hemoglobin: 14.8 g/dL (ref 12.0–15.0)
Immature Granulocytes: 0 %
Lymphocytes Relative: 27 %
Lymphs Abs: 2.1 10*3/uL (ref 0.7–4.0)
MCH: 28.7 pg (ref 26.0–34.0)
MCHC: 31.2 g/dL (ref 30.0–36.0)
MCV: 91.9 fL (ref 80.0–100.0)
Monocytes Absolute: 0.6 10*3/uL (ref 0.1–1.0)
Monocytes Relative: 8 %
Neutro Abs: 4.7 10*3/uL (ref 1.7–7.7)
Neutrophils Relative %: 61 %
Platelets: 329 10*3/uL (ref 150–400)
RBC: 5.16 MIL/uL — ABNORMAL HIGH (ref 3.87–5.11)
RDW: 13.1 % (ref 11.5–15.5)
WBC: 7.6 10*3/uL (ref 4.0–10.5)
nRBC: 0 % (ref 0.0–0.2)

## 2022-10-20 LAB — HEMOGLOBIN A1C
Hgb A1c MFr Bld: 6.2 % — ABNORMAL HIGH (ref 4.8–5.6)
Mean Plasma Glucose: 131 mg/dL

## 2022-10-20 LAB — GLUCOSE, CAPILLARY: Glucose-Capillary: 101 mg/dL — ABNORMAL HIGH (ref 70–99)

## 2022-10-20 NOTE — Progress Notes (Addendum)
For Short Stay: [place] appointment date:  Bowel Prep reminder:   For Anesthesia: PCP - [facility]. Cardiologist -   Chest x-ray -  EKG -  Stress Test -  ECHO -  Cardiac Cath -  Pacemaker/ICD device last checked: Pacemaker orders received: Device Rep notified:  Spinal Cord Stimulator:  Sleep Study - Yes CPAP - NO  Fasting Blood Sugar - N/A Checks Blood Sugar __0___ times a day Date and result of last Hgb A1c-  Last dose of GLP1 agonist- N/A GLP1 instructions:   Last dose of SGLT-2 inhibitors-  SGLT-2 instructions:   Blood Thinner Instructions: Aspirin Instructions: Last Dose:  Activity level: Can go up a flight of stairs and activities of daily living without stopping and without chest pain and/or shortness of breath   Able to exercise without chest pain and/or shortness of breath  Anesthesia review:   Patient denies shortness of breath, fever, cough and chest pain at PAT appointment   Patient verbalized understanding of instructions that were given to them at the PAT appointment. Patient was also instructed that they will need to review over the PAT instructions again at home before surgery.

## 2022-10-23 NOTE — Discharge Instructions (Signed)

## 2022-10-25 ENCOUNTER — Encounter (HOSPITAL_COMMUNITY): Payer: Self-pay | Admitting: Surgery

## 2022-10-25 NOTE — Anesthesia Preprocedure Evaluation (Signed)
Anesthesia Evaluation  Patient identified by MRN, date of birth, ID band Patient awake    Reviewed: Allergy & Precautions, NPO status , Patient's Chart, lab work & pertinent test results  Airway Mallampati: III  TM Distance: >3 FB Neck ROM: Full    Dental no notable dental hx. (+) Teeth Intact, Dental Advisory Given   Pulmonary shortness of breath and with exertion, former smoker   Pulmonary exam normal breath sounds clear to auscultation       Cardiovascular negative cardio ROS Normal cardiovascular exam Rhythm:Regular Rate:Normal     Neuro/Psych  PSYCHIATRIC DISORDERS Anxiety      Neuromuscular disease    GI/Hepatic   Endo/Other    Morbid obesitySuper MO Pre diabetes  Renal/GU   negative genitourinary   Musculoskeletal  (+) Arthritis , Osteoarthritis,  Back pain with radiculopathy   Abdominal  (+) + obese  Peds  Hematology   Anesthesia Other Findings   Reproductive/Obstetrics                             Anesthesia Physical Anesthesia Plan  ASA: 3  Anesthesia Plan: General   Post-op Pain Management: Precedex, Ketamine IV* and Ofirmev IV (intra-op)*   Induction: Intravenous and Cricoid pressure planned  PONV Risk Score and Plan: 4 or greater and Treatment may vary due to age or medical condition, Midazolam, Dexamethasone, Ondansetron and Scopolamine patch - Pre-op  Airway Management Planned: Oral ETT  Additional Equipment: None  Intra-op Plan:   Post-operative Plan: Extubation in OR  Informed Consent: I have reviewed the patients History and Physical, chart, labs and discussed the procedure including the risks, benefits and alternatives for the proposed anesthesia with the patient or authorized representative who has indicated his/her understanding and acceptance.     Dental advisory given  Plan Discussed with: CRNA and Anesthesiologist  Anesthesia Plan Comments: (2  Peripheral IV's)        Anesthesia Quick Evaluation

## 2022-10-26 ENCOUNTER — Inpatient Hospital Stay (HOSPITAL_COMMUNITY)
Admission: RE | Admit: 2022-10-26 | Discharge: 2022-10-27 | DRG: 621 | Disposition: A | Discharge status: Home / Self Care | Payer: Commercial Managed Care - HMO | Attending: Surgery | Admitting: Surgery

## 2022-10-26 ENCOUNTER — Inpatient Hospital Stay (HOSPITAL_COMMUNITY): Payer: Commercial Managed Care - HMO | Admitting: Anesthesiology

## 2022-10-26 ENCOUNTER — Encounter (HOSPITAL_COMMUNITY)
Admission: RE | Disposition: A | Discharge status: Home / Self Care | Payer: Self-pay | Source: Home / Self Care | Attending: Surgery

## 2022-10-26 ENCOUNTER — Encounter (HOSPITAL_COMMUNITY): Payer: Self-pay | Admitting: Surgery

## 2022-10-26 ENCOUNTER — Other Ambulatory Visit: Payer: Self-pay

## 2022-10-26 DIAGNOSIS — Z6841 Body Mass Index (BMI) 40.0 and over, adult: Secondary | ICD-10-CM

## 2022-10-26 DIAGNOSIS — Z888 Allergy status to other drugs, medicaments and biological substances status: Secondary | ICD-10-CM | POA: Diagnosis not present

## 2022-10-26 DIAGNOSIS — F17210 Nicotine dependence, cigarettes, uncomplicated: Secondary | ICD-10-CM | POA: Diagnosis present

## 2022-10-26 DIAGNOSIS — M1990 Unspecified osteoarthritis, unspecified site: Secondary | ICD-10-CM | POA: Diagnosis not present

## 2022-10-26 DIAGNOSIS — M5417 Radiculopathy, lumbosacral region: Secondary | ICD-10-CM | POA: Diagnosis present

## 2022-10-26 DIAGNOSIS — F419 Anxiety disorder, unspecified: Secondary | ICD-10-CM | POA: Diagnosis not present

## 2022-10-26 DIAGNOSIS — M5136 Other intervertebral disc degeneration, lumbar region: Secondary | ICD-10-CM | POA: Diagnosis present

## 2022-10-26 DIAGNOSIS — Z79899 Other long term (current) drug therapy: Secondary | ICD-10-CM

## 2022-10-26 DIAGNOSIS — M5137 Other intervertebral disc degeneration, lumbosacral region: Secondary | ICD-10-CM | POA: Diagnosis present

## 2022-10-26 DIAGNOSIS — Z975 Presence of (intrauterine) contraceptive device: Secondary | ICD-10-CM

## 2022-10-26 DIAGNOSIS — Z903 Acquired absence of stomach [part of]: Principal | ICD-10-CM

## 2022-10-26 DIAGNOSIS — Z91040 Latex allergy status: Secondary | ICD-10-CM | POA: Diagnosis not present

## 2022-10-26 DIAGNOSIS — Z9851 Tubal ligation status: Secondary | ICD-10-CM | POA: Diagnosis not present

## 2022-10-26 HISTORY — PX: UPPER GI ENDOSCOPY: SHX6162

## 2022-10-26 LAB — CBC
HCT: 45.5 % (ref 36.0–46.0)
Hemoglobin: 14.2 g/dL (ref 12.0–15.0)
MCH: 28.8 pg (ref 26.0–34.0)
MCHC: 31.2 g/dL (ref 30.0–36.0)
MCV: 92.3 fL (ref 80.0–100.0)
Platelets: 304 10*3/uL (ref 150–400)
RBC: 4.93 MIL/uL (ref 3.87–5.11)
RDW: 13.2 % (ref 11.5–15.5)
WBC: 9.8 10*3/uL (ref 4.0–10.5)
nRBC: 0 % (ref 0.0–0.2)

## 2022-10-26 LAB — TYPE AND SCREEN
ABO/RH(D): O POS
Antibody Screen: NEGATIVE

## 2022-10-26 LAB — POCT PREGNANCY, URINE: Preg Test, Ur: NEGATIVE

## 2022-10-26 LAB — GLUCOSE, CAPILLARY: Glucose-Capillary: 101 mg/dL — ABNORMAL HIGH (ref 70–99)

## 2022-10-26 LAB — CREATININE, SERUM
Creatinine, Ser: 0.91 mg/dL (ref 0.44–1.00)
GFR, Estimated: >60 mL/min (ref >60)

## 2022-10-26 LAB — ABO/RH: ABO/RH(D): O POS

## 2022-10-26 LAB — HEMOGLOBIN AND HEMATOCRIT, BLOOD
HCT: 45.9 % (ref 36.0–46.0)
Hemoglobin: 14 g/dL (ref 12.0–15.0)

## 2022-10-26 SURGERY — XI ROBOTIC GASTRIC SLEEVE RESECTION
Anesthesia: General

## 2022-10-26 MED ORDER — ONDANSETRON HCL 4 MG/2ML IJ SOLN
INTRAMUSCULAR | Status: DC | PRN
  Administered 2022-10-26: 4 mg via INTRAVENOUS

## 2022-10-26 MED ORDER — OXYCODONE HCL 5 MG PO TABS: 5.0000 mg | ORAL_TABLET | Freq: Once | ORAL | Status: DC | PRN

## 2022-10-26 MED ORDER — METOPROLOL TARTRATE 5 MG/5ML IV SOLN: 5.0000 mg | Freq: Four times a day (QID) | INTRAVENOUS | Status: DC | PRN

## 2022-10-26 MED ORDER — SODIUM CHLORIDE (PF) 0.9 % IJ SOLN
INTRAMUSCULAR | Status: AC
  Filled 2022-10-26: qty 10

## 2022-10-26 MED ORDER — SODIUM CHLORIDE 0.9 % IV SOLN
2.0000 g | INTRAVENOUS | Status: AC
  Administered 2022-10-26: 2 g via INTRAVENOUS
  Filled 2022-10-26: qty 2

## 2022-10-26 MED ORDER — BUPIVACAINE LIPOSOME 1.3 % IJ SUSP
INTRAMUSCULAR | Status: AC
  Filled 2022-10-26: qty 20

## 2022-10-26 MED ORDER — HYDROMORPHONE HCL 1 MG/ML IJ SOLN: 0.2500 mg | INTRAMUSCULAR | Status: DC | PRN

## 2022-10-26 MED ORDER — FENTANYL CITRATE (PF) 100 MCG/2ML IJ SOLN
INTRAMUSCULAR | Status: AC
  Filled 2022-10-26: qty 2

## 2022-10-26 MED ORDER — SODIUM CHLORIDE (PF) 0.9 % IJ SOLN
INTRAMUSCULAR | Status: DC | PRN
  Administered 2022-10-26: 30 mL

## 2022-10-26 MED ORDER — AMISULPRIDE (ANTIEMETIC) 5 MG/2ML IV SOLN: 10.0000 mg | Freq: Once | INTRAVENOUS | Status: DC | PRN

## 2022-10-26 MED ORDER — LACTATED RINGERS IR SOLN
Status: DC | PRN
  Administered 2022-10-26: 1000 mL

## 2022-10-26 MED ORDER — OXYCODONE HCL 5 MG/5ML PO SOLN: 5.0000 mg | Freq: Once | ORAL | Status: DC | PRN

## 2022-10-26 MED ORDER — MIDAZOLAM HCL 2 MG/2ML IJ SOLN
INTRAMUSCULAR | Status: AC
  Filled 2022-10-26: qty 2

## 2022-10-26 MED ORDER — PROPOFOL 10 MG/ML IV BOLUS
INTRAVENOUS | Status: AC
  Filled 2022-10-26: qty 20

## 2022-10-26 MED ORDER — ROCURONIUM BROMIDE 10 MG/ML (PF) SYRINGE
PREFILLED_SYRINGE | INTRAVENOUS | Status: AC
  Filled 2022-10-26: qty 10

## 2022-10-26 MED ORDER — LIDOCAINE HCL (PF) 2 % IJ SOLN
INTRAMUSCULAR | Status: AC
  Filled 2022-10-26: qty 5

## 2022-10-26 MED ORDER — KETAMINE HCL 10 MG/ML IJ SOLN
INTRAMUSCULAR | Status: DC | PRN
  Administered 2022-10-26: 15 mg via INTRAVENOUS
  Administered 2022-10-26: 20 mg via INTRAVENOUS

## 2022-10-26 MED ORDER — ONDANSETRON HCL 4 MG/2ML IJ SOLN
INTRAMUSCULAR | Status: AC
  Filled 2022-10-26: qty 2

## 2022-10-26 MED ORDER — DEXAMETHASONE SODIUM PHOSPHATE 10 MG/ML IJ SOLN
INTRAMUSCULAR | Status: DC | PRN
  Administered 2022-10-26: 4 mg via INTRAVENOUS

## 2022-10-26 MED ORDER — 0.9 % SODIUM CHLORIDE (POUR BTL) OPTIME
TOPICAL | Status: DC | PRN
  Administered 2022-10-26: 1000 mL

## 2022-10-26 MED ORDER — GLYCOPYRROLATE 0.2 MG/ML IJ SOLN
INTRAMUSCULAR | Status: DC | PRN
  Administered 2022-10-26: .1 mg via INTRAVENOUS

## 2022-10-26 MED ORDER — ONDANSETRON HCL 4 MG/2ML IJ SOLN: 4.0000 mg | Freq: Once | INTRAMUSCULAR | Status: DC | PRN

## 2022-10-26 MED ORDER — ONDANSETRON HCL 4 MG/2ML IJ SOLN
4.0000 mg | INTRAMUSCULAR | Status: DC | PRN
  Filled 2022-10-26: qty 2

## 2022-10-26 MED ORDER — ROCURONIUM BROMIDE 10 MG/ML (PF) SYRINGE
PREFILLED_SYRINGE | INTRAVENOUS | Status: DC | PRN
  Administered 2022-10-26: 100 mg via INTRAVENOUS
  Administered 2022-10-26: 5 mg via INTRAVENOUS
  Administered 2022-10-26: 30 mg via INTRAVENOUS

## 2022-10-26 MED ORDER — MIDAZOLAM HCL 2 MG/2ML IJ SOLN
INTRAMUSCULAR | Status: DC | PRN
  Administered 2022-10-26: 2 mg via INTRAVENOUS

## 2022-10-26 MED ORDER — STERILE WATER FOR IRRIGATION IR SOLN
Status: DC | PRN
  Administered 2022-10-26: 1000 mL

## 2022-10-26 MED ORDER — KETAMINE HCL 10 MG/ML IJ SOLN
INTRAMUSCULAR | Status: AC
  Filled 2022-10-26: qty 1

## 2022-10-26 MED ORDER — BUPIVACAINE LIPOSOME 1.3 % IJ SUSP: 20.0000 mL | Freq: Once | INTRAMUSCULAR | Status: DC

## 2022-10-26 MED ORDER — SCOPOLAMINE 1 MG/3DAYS TD PT72: 1.0000 | MEDICATED_PATCH | Freq: Once | TRANSDERMAL | Status: DC

## 2022-10-26 MED ORDER — KCL IN DEXTROSE-NACL 20-5-0.45 MEQ/L-%-% IV SOLN
INTRAVENOUS | Status: DC
  Filled 2022-10-26 (×2): qty 1000

## 2022-10-26 MED ORDER — EPHEDRINE SULFATE (PRESSORS) 50 MG/ML IJ SOLN
INTRAMUSCULAR | Status: DC | PRN
  Administered 2022-10-26 (×2): 10 mg via INTRAVENOUS

## 2022-10-26 MED ORDER — HEPARIN SODIUM (PORCINE) 5000 UNIT/ML IJ SOLN
5000.0000 [IU] | Freq: Three times a day (TID) | INTRAMUSCULAR | Status: DC
  Administered 2022-10-26 – 2022-10-27 (×2): 5000 [IU] via SUBCUTANEOUS
  Filled 2022-10-26 (×2): qty 1

## 2022-10-26 MED ORDER — LIDOCAINE 2% (20 MG/ML) 5 ML SYRINGE
INTRAMUSCULAR | Status: DC | PRN
  Administered 2022-10-26: 100 mg via INTRAVENOUS

## 2022-10-26 MED ORDER — PANTOPRAZOLE SODIUM 40 MG IV SOLR
40.0000 mg | Freq: Every day | INTRAVENOUS | Status: DC
  Administered 2022-10-26: 40 mg via INTRAVENOUS
  Filled 2022-10-26: qty 10

## 2022-10-26 MED ORDER — LACTATED RINGERS IV SOLN: INTRAVENOUS | Status: DC

## 2022-10-26 MED ORDER — DEXAMETHASONE SODIUM PHOSPHATE 10 MG/ML IJ SOLN
INTRAMUSCULAR | Status: AC
  Filled 2022-10-26: qty 1

## 2022-10-26 MED ORDER — ENSURE MAX PROTEIN PO LIQD
2.0000 [oz_av] | ORAL | Status: DC
  Administered 2022-10-27 (×3): 2 [oz_av] via ORAL

## 2022-10-26 MED ORDER — ORAL CARE MOUTH RINSE: 15.0000 mL | Freq: Once | OROMUCOSAL | Status: AC

## 2022-10-26 MED ORDER — MORPHINE SULFATE (PF) 2 MG/ML IV SOLN: 1.0000 mg | INTRAVENOUS | Status: DC | PRN

## 2022-10-26 MED ORDER — ACETAMINOPHEN 160 MG/5ML PO SOLN: 1000.0000 mg | Freq: Three times a day (TID) | ORAL | Status: DC

## 2022-10-26 MED ORDER — SCOPOLAMINE 1 MG/3DAYS TD PT72
1.0000 | MEDICATED_PATCH | TRANSDERMAL | Status: DC
  Administered 2022-10-26: 1.5 mg via TRANSDERMAL
  Filled 2022-10-26: qty 1

## 2022-10-26 MED ORDER — CHLORHEXIDINE GLUCONATE CLOTH 2 % EX PADS: 6.0000 | MEDICATED_PAD | Freq: Once | CUTANEOUS | Status: DC

## 2022-10-26 MED ORDER — SUGAMMADEX SODIUM 200 MG/2ML IV SOLN
INTRAVENOUS | Status: DC | PRN
  Administered 2022-10-26: 400 mg via INTRAVENOUS

## 2022-10-26 MED ORDER — CHLORHEXIDINE GLUCONATE 0.12 % MT SOLN
15.0000 mL | Freq: Once | OROMUCOSAL | Status: AC
  Administered 2022-10-26: 15 mL via OROMUCOSAL

## 2022-10-26 MED ORDER — OXYCODONE HCL 5 MG/5ML PO SOLN
5.0000 mg | Freq: Four times a day (QID) | ORAL | Status: DC | PRN
  Administered 2022-10-26 – 2022-10-27 (×2): 5 mg via ORAL
  Filled 2022-10-26 (×2): qty 5

## 2022-10-26 MED ORDER — ACETAMINOPHEN 500 MG PO TABS
1000.0000 mg | ORAL_TABLET | Freq: Three times a day (TID) | ORAL | Status: DC
  Administered 2022-10-26 – 2022-10-27 (×3): 1000 mg via ORAL
  Filled 2022-10-26 (×3): qty 2

## 2022-10-26 MED ORDER — PROPOFOL 10 MG/ML IV BOLUS
INTRAVENOUS | Status: DC | PRN
  Administered 2022-10-26: 200 mg via INTRAVENOUS

## 2022-10-26 MED ORDER — APREPITANT 40 MG PO CAPS
40.0000 mg | ORAL_CAPSULE | ORAL | Status: AC
  Administered 2022-10-26: 40 mg via ORAL
  Filled 2022-10-26: qty 1

## 2022-10-26 MED ORDER — FENTANYL CITRATE (PF) 100 MCG/2ML IJ SOLN
INTRAMUSCULAR | Status: DC | PRN
  Administered 2022-10-26: 100 ug via INTRAVENOUS

## 2022-10-26 MED ORDER — ACETAMINOPHEN 500 MG PO TABS
1000.0000 mg | ORAL_TABLET | ORAL | Status: AC
  Administered 2022-10-26: 1000 mg via ORAL
  Filled 2022-10-26: qty 2

## 2022-10-26 MED ORDER — HEPARIN SODIUM (PORCINE) 5000 UNIT/ML IJ SOLN
5000.0000 [IU] | INTRAMUSCULAR | Status: AC
  Administered 2022-10-26: 5000 [IU] via SUBCUTANEOUS
  Filled 2022-10-26: qty 1

## 2022-10-26 MED ORDER — EPHEDRINE 5 MG/ML INJ
INTRAVENOUS | Status: AC
  Filled 2022-10-26: qty 5

## 2022-10-26 SURGICAL SUPPLY — 75 items
ADH SKN CLS APL DERMABOND .7 (GAUZE/BANDAGES/DRESSINGS) ×1
ANTIFOG SOL W/FOAM PAD STRL (MISCELLANEOUS) ×1
APL PRP STRL LF DISP 70% ISPRP (MISCELLANEOUS) ×1
APPLIER CLIP 5 13 M/L LIGAMAX5 (MISCELLANEOUS)
APPLIER CLIP ROT 10 11.4 M/L (STAPLE)
APR CLP MED LRG 11.4X10 (STAPLE)
APR CLP MED LRG 5 ANG JAW (MISCELLANEOUS)
BAG LAPAROSCOPIC 12 15 PORT 16 (BASKET) IMPLANT
BAG RETRIEVAL 12/15 (BASKET) ×1
BLADE SURG 15 STRL LF DISP TIS (BLADE) ×1 IMPLANT
BLADE SURG 15 STRL SS (BLADE) ×1
CANNULA REDUC XI 12-8 STAPL (CANNULA) ×1
CANNULA REDUCER 12-8 DVNC XI (CANNULA) ×1 IMPLANT
CHLORAPREP W/TINT 26 (MISCELLANEOUS) ×1 IMPLANT
CLIP APPLIE 5 13 M/L LIGAMAX5 (MISCELLANEOUS) IMPLANT
CLIP APPLIE ROT 10 11.4 M/L (STAPLE) IMPLANT
COVER SURGICAL LIGHT HANDLE (MISCELLANEOUS) ×1 IMPLANT
DERMABOND ADVANCED .7 DNX12 (GAUZE/BANDAGES/DRESSINGS) ×1 IMPLANT
DRAPE ARM DVNC X/XI (DISPOSABLE) ×4 IMPLANT
DRAPE COLUMN DVNC XI (DISPOSABLE) ×1 IMPLANT
DRAPE DA VINCI XI ARM (DISPOSABLE) ×4
DRAPE DA VINCI XI COLUMN (DISPOSABLE) ×1
ELECT REM PT RETURN 15FT ADLT (MISCELLANEOUS) ×1 IMPLANT
GLOVE BIO SURGEON STRL SZ 6 (GLOVE) ×3 IMPLANT
GLOVE BIO SURGEON STRL SZ8 (GLOVE) ×2 IMPLANT
GLOVE INDICATOR 6.5 STRL GRN (GLOVE) ×3 IMPLANT
GOWN STRL REUS W/ TWL XL LVL3 (GOWN DISPOSABLE) ×3 IMPLANT
GOWN STRL REUS W/TWL XL LVL3 (GOWN DISPOSABLE) ×3
GRASPER SUT TROCAR 14GX15 (MISCELLANEOUS) ×1 IMPLANT
IRRIG SUCT STRYKERFLOW 2 WTIP (MISCELLANEOUS) ×1
IRRIGATION SUCT STRKRFLW 2 WTP (MISCELLANEOUS) ×1 IMPLANT
KIT BASIN OR (CUSTOM PROCEDURE TRAY) ×1 IMPLANT
KIT TURNOVER KIT A (KITS) IMPLANT
LUBRICANT JELLY K Y 4OZ (MISCELLANEOUS) IMPLANT
MARKER SKIN DUAL TIP RULER LAB (MISCELLANEOUS) ×1 IMPLANT
MAT PREVALON FULL STRYKER (MISCELLANEOUS) ×1 IMPLANT
NDL SPNL 22GX3.5 QUINCKE BK (NEEDLE) ×1 IMPLANT
NEEDLE SPNL 22GX3.5 QUINCKE BK (NEEDLE) ×1 IMPLANT
OBTURATOR OPTICAL STANDARD 8MM (TROCAR) ×1
OBTURATOR OPTICAL STND 8 DVNC (TROCAR) ×1
OBTURATOR OPTICALSTD 8 DVNC (TROCAR) ×1 IMPLANT
PACK CARDIOVASCULAR III (CUSTOM PROCEDURE TRAY) ×1 IMPLANT
RELOAD STAPLE 60 2.5 WHT DVNC (STAPLE) IMPLANT
RELOAD STAPLE 60 3.5 BLU DVNC (STAPLE) IMPLANT
RELOAD STAPLER 2.5X60 WHT DVNC (STAPLE) ×4 IMPLANT
RELOAD STAPLER 3.5X60 BLU DVNC (STAPLE) ×3 IMPLANT
SCISSORS LAP 5X35 DISP (ENDOMECHANICALS) IMPLANT
SEAL UNIV 5-12 XI (MISCELLANEOUS) ×4 IMPLANT
SEAL XI UNIVERSAL 5-12 (MISCELLANEOUS) ×4
SEALER VESSEL DA VINCI XI (MISCELLANEOUS) ×1
SEALER VESSEL EXT DVNC XI (MISCELLANEOUS) ×1 IMPLANT
SLEEVE GASTRECTOMY 36FR VISIGI (MISCELLANEOUS) ×1 IMPLANT
SOL ELECTROSURG ANTI STICK (MISCELLANEOUS) ×1
SOLUTION ANTFG W/FOAM PAD STRL (MISCELLANEOUS) ×1 IMPLANT
SOLUTION ELECTROSURG ANTI STCK (MISCELLANEOUS) ×1 IMPLANT
SPIKE FLUID TRANSFER (MISCELLANEOUS) ×1 IMPLANT
STAPLER 60 DA VINCI SURE FORM (STAPLE) ×1
STAPLER 60 SUREFORM DVNC (STAPLE) ×1 IMPLANT
STAPLER RELOAD 2.5X60 WHITE (STAPLE) ×4
STAPLER RELOAD 2.5X60 WHT DVNC (STAPLE) ×4
STAPLER RELOAD 3.5X60 BLU DVNC (STAPLE) ×3
STAPLER RELOAD 3.5X60 BLUE (STAPLE) ×3
SUT ETHIBOND 0 36 GRN (SUTURE) IMPLANT
SUT MNCRL AB 4-0 PS2 18 (SUTURE) ×2 IMPLANT
SUT VICRYL 0 TIES 12 18 (SUTURE) ×1 IMPLANT
SYR 20ML ECCENTRIC (SYRINGE) IMPLANT
SYR 20ML LL LF (SYRINGE) ×1 IMPLANT
TOWEL OR 17X26 10 PK STRL BLUE (TOWEL DISPOSABLE) ×1 IMPLANT
TRAY FOL W/BAG SLVR 16FR STRL (SET/KITS/TRAYS/PACK) IMPLANT
TRAY FOLEY MTR SLVR 16FR STAT (SET/KITS/TRAYS/PACK) IMPLANT
TRAY FOLEY W/BAG SLVR 16FR LF (SET/KITS/TRAYS/PACK) ×1
TROCAR ADV FIXATION 5X100MM (TROCAR) IMPLANT
TROCAR XCEL NON-BLD 5MMX100MML (ENDOMECHANICALS) ×1 IMPLANT
TUBE CALIBRATION LAPBAND (TUBING) IMPLANT
TUBING INSUFFLATION 10FT LAP (TUBING) ×1 IMPLANT

## 2022-10-26 NOTE — Interval H&P Note (Signed)
History and Physical Interval Note:  10/26/2022 7:17 AM  Phyllis Daniels  has presented today for surgery, with the diagnosis of MORBID OBESITY.  The various methods of treatment have been discussed with the patient and family. After consideration of risks, benefits and other options for treatment, the patient has consented to  Procedure(s): ROBOTIC SLEEVE GASTRECTOMY (N/A) UPPER GI ENDOSCOPY (N/A) as a surgical intervention.  The patient's history has been reviewed, patient examined, no change in status, stable for surgery.  I have reviewed the patient's chart and labs.  Questions were answered to the patient's satisfaction.     Pedro Earls

## 2022-10-26 NOTE — Anesthesia Procedure Notes (Addendum)
Procedure Name: Intubation Date/Time: 10/26/2022 7:43 AM  Performed by: Niel Hummer, CRNAPre-anesthesia Checklist: Patient identified, Emergency Drugs available, Suction available and Patient being monitored Patient Re-evaluated:Patient Re-evaluated prior to induction Oxygen Delivery Method: Circle System Utilized Preoxygenation: Pre-oxygenation with 100% oxygen Induction Type: IV induction Ventilation: Mask ventilation without difficulty and Oral airway inserted - appropriate to patient size Laryngoscope Size: Glidescope and 4 Grade View: Grade I Tube type: Oral Tube size: 7.0 mm Number of attempts: 1 Airway Equipment and Method: Stylet, Oral airway and Video-laryngoscopy Placement Confirmation: ETT inserted through vocal cords under direct vision, positive ETCO2 and breath sounds checked- equal and bilateral Secured at: 21 cm Tube secured with: Tape Dental Injury: Teeth and Oropharynx as per pre-operative assessment  Difficulty Due To: Difficulty was anticipated Comments: Elective glidescope. Large tongue and prominent teeth, recommend glide.

## 2022-10-26 NOTE — Transfer of Care (Signed)
Immediate Anesthesia Transfer of Care Note  Patient: Phyllis Daniels  Procedure(s) Performed: ROBOTIC SLEEVE GASTRECTOMY UPPER GI ENDOSCOPY  Patient Location: PACU  Anesthesia Type:General  Level of Consciousness: drowsy  Airway & Oxygen Therapy: Patient Spontanous Breathing and Patient connected to face mask oxygen  Post-op Assessment: Report given to RN, Post -op Vital signs reviewed and stable, and Patient moving all extremities X 4  Post vital signs: Reviewed and stable  Last Vitals:  Vitals Value Taken Time  BP 137/74   Temp    Pulse 63 10/26/22 1006  Resp 18 10/26/22 1006  SpO2 93 % 10/26/22 1006  Vitals shown include unvalidated device data.  Last Pain:  Vitals:   10/26/22 0611  PainSc: 0-No pain      Patients Stated Pain Goal: 3 ([id number])  Complications:  Encounter Notable Events  Notable Event Outcome Phase Comment  Difficult to intubate - expected  Intraprocedure Filed from anesthesia note documentation.

## 2022-10-26 NOTE — Progress Notes (Signed)
Patient has been unable to ambulate at this time due to being too sleepy from anaesthesia.

## 2022-10-26 NOTE — Progress Notes (Signed)
Discussed QI "Goals for Discharge" document with patient including ambulation in halls, Incentive Spirometry use every hour, and oral care.  Also discussed pain and nausea control.  Enabled or verified head of bed 30 degree alarm activated.  BSTOP education provided including BSTOP information guide, "Guide for Pain Management after your Bariatric Procedure".  Diet progression education provided including "Bariatric Surgery Post-Op Food Plan Phase 1: Liquids".  Questions answered.  Will continue to partner with bedside RN and follow up with patient per protocol.    Thank you,  Marcel Gary Cylus [place], RN, MSN Bariatric Nurse Coordinator [phone] (office)  

## 2022-10-26 NOTE — Op Note (Addendum)
   Surgeon: Kaylyn Lim, MD, FACS  Asst:  Romana Juniper, MD, FACS   October 26, 2022 Anes:  General endotracheal  Procedure: Robotic sleeve gastrectomy and upper endoscopy  Diagnosis: Morbid obesity, BMI 55  Complications: None noted  EBL:   minimal cc  Description of Procedure:  The patient was take to OR 2 and given general anesthesia.  The abdomen was prepped with Chloroprep and draped sterilely.  A timeout was performed.  Access to the abdomen was achieved with a 5 mm Optiview through the left upper quadrant.  Exparel block was performed after the 4 trocars were placed including trading out the 5 for a robotic 8.  The left lateral port was maintained for Dr. Kae Heller and the three robotic ports were used for the camera and the fenestrated bipolar and the vessel sealer.  Following insufflation, the state of the abdomen was found to be free of adhesions.  No dimple or evidence of a hiatal hernia was seen.  .  The ViSiGi 36Fr tube was inserted to deflate the stomach and was pulled back into the esophagus.  Four trocars were placed including a 12 mm for the robotic stapler.    The pylorus was identified and we measured 6 cm back and marked the antrum.  At that point we began dissection to take down the greater curvature of the stomach using the vessel sealer.  This dissection was taken all the way up to the left crus.  Posterior attachments of the stomach were also taken down.    The ViSiGi tube was then passed into the antrum and suction applied so that it was snug along the lessor curvature.  The "crow's foot" or incisura was identified.  The sleeve gastrectomy was begun using the Sureform platform stapler beginning with a blue load x 3 and then white loads.  When the sleeve was complete the tube was taken off suction and insufflated briefly.  The tube was withdrawn.  Upper endoscopy was then performed by Dr. Hassell Done and a symmetric tube was found.  The scope was passed into the antrum.  The EG  junction was noted and no hiatal hernia was seen.     The specimen was extracted through the 15 trocar site. This site was closed with a PMI and 0 vicryl.   Local block was provided by infiltrating abdomen as a TAP block and then closed 4-0 Monocryl and Dermabond.    Matt B. Hassell Done, MD, [facility], [place]

## 2022-10-26 NOTE — Anesthesia Postprocedure Evaluation (Signed)
Anesthesia Post Note  Patient: Phyllis Daniels  Procedure(s) Performed: ROBOTIC SLEEVE GASTRECTOMY UPPER GI ENDOSCOPY     Patient location during evaluation: PACU Anesthesia Type: General Level of consciousness: awake and alert and oriented Pain management: pain level controlled Vital Signs Assessment: post-procedure vital signs reviewed and stable Respiratory status: spontaneous breathing, nonlabored ventilation and respiratory function stable Cardiovascular status: blood pressure returned to baseline and stable Postop Assessment: no apparent nausea or vomiting Anesthetic complications: yes   Encounter Notable Events  Notable Event Outcome Phase Comment  Difficult to intubate - expected  Intraprocedure Filed from anesthesia note documentation.    Last Vitals:  Vitals:   10/26/22 1030 10/26/22 1045  BP: (!) 144/91 132/74  Pulse: 65 71  Resp: 19 (!) 21  Temp:    SpO2: 95% 91%    Last Pain:  Vitals:   10/26/22 1005  PainSc: Asleep                 Seneca Hoback A.

## 2022-10-26 NOTE — Progress Notes (Signed)
PHARMACY CONSULT FOR:  Risk Assessment for Post-Discharge VTE Following Bariatric Surgery  Post-Discharge VTE Risk Assessment: This patient's probability of 30-day post-discharge VTE is increased due to the factors marked: X Sleeve gastrectomy   Liver disorder (transplant, cirrhosis, or nonalcoholic steatohepatitis)   Hx of VTE   Hemorrhage requiring transfusion   GI perforation, leak, or obstruction   ====================================================    Female    Age >/=60 years  X  BMI >/=50 kg/m2    CHF    Dyspnea at Rest    Paraplegia  X  Non-gastric-band surgery    Operation Time >/=3 hr    Return to OR     Length of Stay >/= 3 d   Hypercoagulable condition   Significant venous stasis    Predicted probability of 30-day post-discharge VTE: 0.27%  Other patient-specific factors to consider: n/a   Recommendation for Discharge: No pharmacologic prophylaxis post-discharge Follow daily and recalculate estimated 30d VTE risk if returns to OR or LOS reaches 3 days.   Phyllis Daniels is a 42 y.o. female who underwent laparoscopic sleeve gastrectomy on 10/26/22   Case start: 0822 Case end: 0955   Allergies  Allergen Reactions   Tuberculin Tests Swelling    redness   Latex Rash    Patient Measurements: Height: 6\' 1"  (185.4 cm) Weight: (!) 183.8 kg (405 lb 3.2 oz) IBW/kg (Calculated) : 75.4 Body mass index is 53.46 kg/m.  Recent Labs    10/26/22 1101  WBC 9.8  HGB 14.2  HCT 45.5  PLT 304  CREATININE 0.91   Estimated Creatinine Clearance: 151 mL/min (by C-G formula based on SCr of 0.91 mg/dL).  Past Medical History:  Diagnosis Date   Abnormal Pap smear of cervix    Anxiety    Back pain    DDD (degenerative disc disease), lumbar    Dyspnea    Pre-diabetes     Medications Prior to Admission  Medication Sig Dispense Refill Last Dose   cyanocobalamin (VITAMIN B12) 1000 MCG tablet Take 1,000 mcg by mouth daily.   Past Week   ibuprofen (ADVIL) 800 MG  tablet Take 800 mg by mouth every 8 (eight) hours as needed for moderate pain. Every 8 hours   Past Week   levonorgestrel (MIRENA) 20 MCG/DAY IUD 1 each by Intrauterine route once.      methocarbamol (ROBAXIN) 500 MG tablet Take 500 mg by mouth 2 (two) times daily as needed for muscle spasms.   Past Week     Reuel Boom, PharmD, BCPS [phone] 10/26/2022, 1:21 PM

## 2022-10-26 NOTE — TOC CM/SW Note (Signed)
Transition of Care [facility]) Screening Note  Patient Details  Name: Phyllis Daniels Date of Birth: May 04, 1981  Transition of [facility]) CM/SW Contact:    Sherie Don, LCSW Phone Number: 10/26/2022, 12:30 PM  Transition of Care Department [facility]) has reviewed patient and no TOC needs have been identified at this time. We will continue to monitor patient advancement through interdisciplinary progression rounds. If new patient transition needs arise, please place a TOC consult.

## 2022-10-27 ENCOUNTER — Encounter (HOSPITAL_COMMUNITY): Payer: Self-pay | Admitting: Surgery

## 2022-10-27 LAB — CBC WITH DIFFERENTIAL/PLATELET
Abs Immature Granulocytes: 0.04 10*3/uL (ref 0.00–0.07)
Basophils Absolute: 0 10*3/uL (ref 0.0–0.1)
Basophils Relative: 0 %
Eosinophils Absolute: 0 10*3/uL (ref 0.0–0.5)
Eosinophils Relative: 0 %
HCT: 43.5 % (ref 36.0–46.0)
Hemoglobin: 13.6 g/dL (ref 12.0–15.0)
Immature Granulocytes: 0 %
Lymphocytes Relative: 17 %
Lymphs Abs: 2.3 10*3/uL (ref 0.7–4.0)
MCH: 28.8 pg (ref 26.0–34.0)
MCHC: 31.3 g/dL (ref 30.0–36.0)
MCV: 92 fL (ref 80.0–100.0)
Monocytes Absolute: 1 10*3/uL (ref 0.1–1.0)
Monocytes Relative: 7 %
Neutro Abs: 10.3 10*3/uL — ABNORMAL HIGH (ref 1.7–7.7)
Neutrophils Relative %: 76 %
Platelets: 313 10*3/uL (ref 150–400)
RBC: 4.73 MIL/uL (ref 3.87–5.11)
RDW: 13.1 % (ref 11.5–15.5)
WBC: 13.6 10*3/uL — ABNORMAL HIGH (ref 4.0–10.5)
nRBC: 0 % (ref 0.0–0.2)

## 2022-10-27 LAB — SURGICAL PATHOLOGY

## 2022-10-27 MED ORDER — PANTOPRAZOLE SODIUM 40 MG PO TBEC: 40.0000 mg | DELAYED_RELEASE_TABLET | Freq: Every day | ORAL | 0 refills | Status: AC

## 2022-10-27 MED ORDER — ONDANSETRON 4 MG PO TBDP: 4.0000 mg | ORAL_TABLET | Freq: Four times a day (QID) | ORAL | 0 refills | Status: DC | PRN

## 2022-10-27 MED ORDER — ACETAMINOPHEN 500 MG PO TABS: 1000.0000 mg | ORAL_TABLET | Freq: Three times a day (TID) | ORAL | 0 refills | Status: AC

## 2022-10-27 MED ORDER — OXYCODONE HCL 5 MG PO TABS: 5.0000 mg | ORAL_TABLET | Freq: Four times a day (QID) | ORAL | 0 refills | Status: AC | PRN

## 2022-10-27 NOTE — Progress Notes (Signed)
Discharge instructions given to patient and all questions were answered.  

## 2022-10-27 NOTE — Discharge Summary (Signed)
Physician Discharge Summary  Patient ID: Phyllis Daniels MRN: [id number] DOB/AGE: 43-06-1981 43 y.o.  PCP: Medicine, [place] [facility]  Admit date: 10/26/2022 Discharge date: 10/27/2022  Admission Diagnoses:  obesity, BMI 55  Discharge Diagnoses:  same  Principal Problem:   Status post robotic sleeve gastrectomy   Surgery:  robotic sleeve gastectomy  Discharged Condition: improved  Hospital Course:   Had robotic sleeve gastrectomy on Monday and advanced to full liquids.  VSS and doing well  Consults: none  Significant Diagnostic Studies: none    Discharge Exam: Blood pressure 129/83, pulse (!) 55, temperature 98.4 F (36.9 C), temperature source Oral, resp. rate 14, height 6\' 1"  (1.854 m), weight (!) 183.8 kg, SpO2 96 %. Incisions OK  Disposition: Discharge disposition: 01-Home or Self Care       Discharge Instructions     Ambulate hourly while awake   Complete by: As directed    Call MD for:  difficulty breathing, headache or visual disturbances   Complete by: As directed    Call MD for:  persistant dizziness or light-headedness   Complete by: As directed    Call MD for:  persistant nausea and vomiting   Complete by: As directed    Call MD for:  redness, tenderness, or signs of infection (pain, swelling, redness, odor or green/yellow discharge around incision site)   Complete by: As directed    Call MD for:  severe uncontrolled pain   Complete by: As directed    Call MD for:  temperature >101 F   Complete by: As directed    Diet bariatric full liquid   Complete by: As directed    Incentive spirometry   Complete by: As directed    Perform hourly while awake      Allergies as of 10/27/2022       Reactions   Tuberculin Tests Swelling   redness   Latex Rash        Medication List     TAKE these medications    acetaminophen 500 MG tablet Commonly known as: TYLENOL Take 2 tablets (1,000 mg total) by mouth every 8 (eight) hours for 5 days.    cyanocobalamin 1000 MCG tablet Commonly known as: VITAMIN B12 Take 1,000 mcg by mouth daily.   ibuprofen 800 MG tablet Commonly known as: ADVIL Take 800 mg by mouth every 8 (eight) hours as needed for moderate pain. Every 8 hours Notes to patient: Avoid NSAIDs after surgery due to risk of GI (stomach) ulcer.    levonorgestrel 20 MCG/DAY Iud Commonly known as: MIRENA 1 each by Intrauterine route once. Notes to patient: Medication may not be effective for the next 30 days.  Use precaution if necessary.     methocarbamol 500 MG tablet Commonly known as: ROBAXIN Take 500 mg by mouth 2 (two) times daily as needed for muscle spasms.   ondansetron 4 MG disintegrating tablet Commonly known as: ZOFRAN-ODT Take 1 tablet (4 mg total) by mouth every 6 (six) hours as needed for nausea or vomiting.   oxyCODONE 5 MG immediate release tablet Commonly known as: Oxy IR/ROXICODONE Take 1 tablet (5 mg total) by mouth every 6 (six) hours as needed for severe pain.   pantoprazole 40 MG tablet Commonly known as: PROTONIX Take 1 tablet (40 mg total) by mouth daily.        Follow-up Information     Maczis, Carlena Hurl, [place]. Go on 11/19/2022.   Specialty: General Surgery Why: Please arrive 15 minutes prior to your  appointment at 9:15pm with Glenna Durand on behalf of Dr. Carter Kitten information: [place] [address] [phone]         Johnathan Hausen, MD. Go on 12/16/2022.   Specialty: General Surgery Why: Please arrive 15 minutes prior to your appointment at [facility] information: [place] [address] [phone]                 Signed: Pedro Earls 10/27/2022, 12:48 PM  obe

## 2022-10-27 NOTE — Progress Notes (Signed)
Patient alert and oriented, pain is controlled. Patient is tolerating fluids, advanced to protein shake today, patient is tolerating well. Reviewed Gastric sleeve/bypass discharge instructions with patient and patient is able to articulate understanding. Provided information on BELT program, Support Group and [facility] outpatient pharmacy. Communicated general update of patient status to surgeon. All questions answered. 24hr fluid recall is 660 mL Per hydration protocol, bariatric nurse coordinator to make follow-up phone call within one week.    Thank you,  Calton Dach, RN, MSN Bariatric Nurse Coordinator [phone] (office)

## 2022-11-02 ENCOUNTER — Telehealth (HOSPITAL_COMMUNITY): Payer: Self-pay | Admitting: *Deleted

## 2022-11-02 NOTE — Telephone Encounter (Signed)
Attempted to call pt, voicemail not accessible, will attempt to call pt again on 4/2

## 2022-11-04 ENCOUNTER — Telehealth (HOSPITAL_COMMUNITY): Payer: Self-pay | Admitting: *Deleted

## 2022-11-04 NOTE — Telephone Encounter (Signed)
Will attempt to call the pt back again

## 2022-11-05 ENCOUNTER — Telehealth (HOSPITAL_COMMUNITY): Payer: Self-pay | Admitting: *Deleted

## 2022-11-05 NOTE — Telephone Encounter (Addendum)
1. Tell me about your pain and pain management?    Pt denies any pain.   2. Let's talk about fluid intake. How much total fluid are you taking in?    Pt states that she is working to meet goal of 64 oz of fluid today, but just feels very full. Pt has only been able to consume 27 oz of fluid per day thus far. Pt encouraged to continue to work towards meeting goal, and to set alarms to help with reminders/cues to sip liquids. Pt denied dehydration symptoms. Pt instructed to assess status and suggestions daily utilizing Hydration Action Plan on discharge folder and to call [facility] if in the "red zone".    3. How much protein have you taken in the last day?   Pt states that she is working to meet the goal of 60g of protein daily, but has only been able to consume on average 30g of protein. Pt plans to drink the reminder of goal throughout the day to try to meet criteria.    4. Have you had nausea? Tell me about when you have experienced nausea and what you did to help?   Pt denies nausea.   5. Has the frequency or color changed with your urine?   Pt states that s/he is urinating "fine" her pee has gotten a little darker in color. Pt reminded to call [facility] for consumption of less than 35oz w/in a 2 day period  6. Tell me what your incisions look like?   "Incisions look fine". Pt denies a fever, chills. Pt states incisions are not swollen, open, or draining. Pt encouraged to call [facility] if incisions change.    7. Have you been passing gas? BM?   Pt states that they had BMs and then it has been since last Sunday that they had a BM. Pt instructed to take either Miralax or MoM as instructed per "Gastric Bypass/Sleeve Discharge Home Care Instructions". Pt to call surgeon's office if not able to have BM with medication.  Pt acknowledged and stated that they would call the office    8. If a problem or question were to arise who would you call? Do you know contact numbers for [place], [facility], and [facility]?   Pt  knows to call [facility] for surgical, [facility] for nutrition, and [facility] for non-urgent questions or concerns. Pt denies dehydration symptoms. Pt can describe s/sx of dehydration.   9. How has the walking going?   Pt states s/he is walking around and able to be active without difficulty.

## 2022-11-10 ENCOUNTER — Encounter: Payer: Self-pay | Admitting: Dietician

## 2022-11-10 ENCOUNTER — Encounter: Payer: Commercial Managed Care - HMO | Attending: Surgery | Admitting: Dietician

## 2022-11-10 VITALS — Ht 73.0 in | Wt 377.7 lb

## 2022-11-10 DIAGNOSIS — E669 Obesity, unspecified: Secondary | ICD-10-CM | POA: Diagnosis present

## 2022-11-10 NOTE — Progress Notes (Signed)
2 Week Post-Operative Nutrition Class   Patient was seen on 11/10/2022 for Post-Operative Nutrition education at the [facility].    Surgery date: 10/26/2022 Surgery type: Sleeve Gastrectomy  Anthropometrics  Start weight at [facility]: 420.5 lbs (date: 02/24/2022)  Height: 73 in Weight today: 377.7 lbs BMI: 49.83 kg/m2     Clinical  Medical hx: Obesity, sleep apnea, arthritis Medications: Methocarbamol, vitamin B12, Ibuprofen  Labs: no recent labs in EMR Notable signs/symptoms: none noted Any previous deficiencies? No Bowel Habits: Every day to every other day no complaints   Body Composition Scale 11/10/2022  Current Body Weight 377.7  Total Body Fat % 49.5  Visceral Fat 19  Fat-Free Mass % 50.4   Total Body Water % 39.7  Muscle-Mass lbs 40.2  BMI 49.7  Body Fat Displacement          Torso  lbs 116.3         Left Leg  lbs 23.2         Right Leg  lbs 23.2         Left Arm  lbs 11.6         Right Arm  lbs 11.6      The following the learning objectives were met by the patient during this course: Identifies Soft Prepped Plan Advancement Guide  Identifies Soft, High Proteins (Phase 1), beginning 2 weeks post-operatively to 3 weeks post-operatively Identifies Additional Soft High Proteins, soft non-starchy vegetables, fruits and starches (Phase 2), beginning 3 weeks post-operatively to 3 months post-operatively Identifies appropriate sources of fluids, proteins, vegetables, fruits and starches Identifies appropriate fat sources and healthy verses unhealthy fat types   States protein, vegetable, fruit and starch recommendations and appropriate sources post-operatively Identifies the need for appropriate texture modifications, mastication, and bite sizes when consuming solids Identifies appropriate fat consumption and sources Identifies appropriate multivitamin and calcium sources post-operatively Describes the need for physical activity  post-operatively and will follow MD recommendations States when to call healthcare provider regarding medication questions or post-operative complications   Handouts given during class include: Soft Prepped Plan Advancement Guide   Follow-Up Plan: Patient will follow-up at [facility] in 10 weeks for 3 month post-op nutrition visit for diet advancement per MD.

## 2022-11-19 ENCOUNTER — Telehealth: Payer: Self-pay | Admitting: Skilled Nursing Facility1

## 2022-11-19 NOTE — Telephone Encounter (Signed)
RD called pt to verify fluid intake once starting soft, solid proteins 2 week post-bariatric surgery.   Daily Fluid intake:  Daily Protein intake:  Bowel Habits:   Concerns/issues:   The call could not be completed after 3 attempts.

## 2024-03-01 ENCOUNTER — Emergency Department (HOSPITAL_COMMUNITY)
Admission: EM | Admit: 2024-03-01 | Discharge: 2024-03-01 | Disposition: A | Discharge status: Home / Self Care | Payer: Self-pay | Attending: Emergency Medicine | Admitting: Emergency Medicine

## 2024-03-01 ENCOUNTER — Other Ambulatory Visit: Payer: Self-pay

## 2024-03-01 ENCOUNTER — Encounter (HOSPITAL_COMMUNITY): Payer: Self-pay

## 2024-03-01 DIAGNOSIS — R112 Nausea with vomiting, unspecified: Secondary | ICD-10-CM | POA: Insufficient documentation

## 2024-03-01 DIAGNOSIS — R197 Diarrhea, unspecified: Secondary | ICD-10-CM | POA: Insufficient documentation

## 2024-03-01 DIAGNOSIS — Z91040 Latex allergy status: Secondary | ICD-10-CM | POA: Insufficient documentation

## 2024-03-01 LAB — COMPREHENSIVE METABOLIC PANEL WITH GFR
ALT: 14 U/L (ref 0–44)
AST: 16 U/L (ref 15–41)
Albumin: 3.8 g/dL (ref 3.5–5.0)
Alkaline Phosphatase: 92 U/L (ref 38–126)
Anion gap: 7 (ref 5–15)
BUN: 10 mg/dL (ref 6–20)
CO2: 26 mmol/L (ref 22–32)
Calcium: 9.2 mg/dL (ref 8.9–10.3)
Chloride: 107 mmol/L (ref 98–111)
Creatinine, Ser: 0.75 mg/dL (ref 0.44–1.00)
GFR, Estimated: >60 mL/min (ref >60)
Glucose, Bld: 80 mg/dL (ref 70–99)
Potassium: 3.6 mmol/L (ref 3.5–5.1)
Sodium: 140 mmol/L (ref 135–145)
Total Bilirubin: 1.1 mg/dL (ref 0.0–1.2)
Total Protein: 6.9 g/dL (ref 6.5–8.1)

## 2024-03-01 LAB — CBC WITH DIFFERENTIAL/PLATELET
Abs Immature Granulocytes: 0.01 K/uL (ref 0.00–0.07)
Basophils Absolute: 0.1 K/uL (ref 0.0–0.1)
Basophils Relative: 1 %
Eosinophils Absolute: 0.2 K/uL (ref 0.0–0.5)
Eosinophils Relative: 4 %
HCT: 42.7 % (ref 36.0–46.0)
Hemoglobin: 13.5 g/dL (ref 12.0–15.0)
Immature Granulocytes: 0 %
Lymphocytes Relative: 30 %
Lymphs Abs: 1.6 K/uL (ref 0.7–4.0)
MCH: 29.9 pg (ref 26.0–34.0)
MCHC: 31.6 g/dL (ref 30.0–36.0)
MCV: 94.5 fL (ref 80.0–100.0)
Monocytes Absolute: 0.5 K/uL (ref 0.1–1.0)
Monocytes Relative: 9 %
Neutro Abs: 3.1 K/uL (ref 1.7–7.7)
Neutrophils Relative %: 56 %
Platelets: 251 K/uL (ref 150–400)
RBC: 4.52 MIL/uL (ref 3.87–5.11)
RDW: 13.2 % (ref 11.5–15.5)
WBC: 5.5 K/uL (ref 4.0–10.5)
nRBC: 0 % (ref 0.0–0.2)

## 2024-03-01 LAB — URINALYSIS, ROUTINE W REFLEX MICROSCOPIC
Bacteria, UA: NONE SEEN
Bilirubin Urine: NEGATIVE
Glucose, UA: NEGATIVE mg/dL
Hgb urine dipstick: NEGATIVE
Ketones, ur: NEGATIVE mg/dL
Nitrite: NEGATIVE
Protein, ur: NEGATIVE mg/dL
Specific Gravity, Urine: 1.02 (ref 1.005–1.030)
pH: 6 (ref 5.0–8.0)

## 2024-03-01 LAB — RESP PANEL BY RT-PCR (RSV, FLU A&B, COVID)  RVPGX2
Influenza A by PCR: NEGATIVE
Influenza B by PCR: NEGATIVE
Resp Syncytial Virus by PCR: NEGATIVE
SARS Coronavirus 2 by RT PCR: NEGATIVE

## 2024-03-01 LAB — LIPASE, BLOOD: Lipase: 31 U/L (ref 11–51)

## 2024-03-01 MED ORDER — ONDANSETRON HCL 4 MG/2ML IJ SOLN
4.0000 mg | Freq: Once | INTRAMUSCULAR | Status: AC
  Administered 2024-03-01: 4 mg via INTRAVENOUS
  Filled 2024-03-01: qty 2

## 2024-03-01 MED ORDER — DICYCLOMINE HCL 20 MG PO TABS: 20.0000 mg | ORAL_TABLET | Freq: Two times a day (BID) | ORAL | 0 refills | Status: AC

## 2024-03-01 MED ORDER — ONDANSETRON 4 MG PO TBDP: 4.0000 mg | ORAL_TABLET | Freq: Three times a day (TID) | ORAL | 0 refills | Status: AC | PRN

## 2024-03-01 MED ORDER — SODIUM CHLORIDE 0.9 % IV BOLUS
1000.0000 mL | Freq: Once | INTRAVENOUS | Status: AC
  Administered 2024-03-01: 1000 mL via INTRAVENOUS

## 2024-03-01 MED ORDER — ONDANSETRON HCL 4 MG/2ML IJ SOLN: 4.0000 mg | Freq: Once | INTRAMUSCULAR | Status: DC | PRN

## 2024-03-01 NOTE — ED Provider Notes (Signed)
[place] [facility] AT [facility] Provider Note   CSN: [id number] Arrival date & time: 03/01/24  1310     Patient presents with: Emesis   Phyllis Daniels is a 43 y.o. female.   Patient is a 43 year old female who presents emergency department with a chief complaint of nausea, vomiting, diarrhea since yesterday.  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  Patient denies any active abdominal pain at this time.  She has had no melena or hematochezia.  She denies any associated fever, chills, chest pain, shortness of breath.   Emesis Associated symptoms: diarrhea        Prior to Admission medications   Medication Sig Start Date End Date Taking? Authorizing Provider  cyanocobalamin  (VITAMIN B12) 1000 MCG tablet Take 1,000 mcg by mouth daily.    [provider]  ibuprofen  (ADVIL ) 800 MG tablet Take 800 mg by mouth every 8 (eight) hours as needed for moderate pain. Every 8 hours 01/23/20   [provider]  levonorgestrel (MIRENA) 20 MCG/DAY IUD 1 each by Intrauterine route once.    [provider]  methocarbamol  (ROBAXIN ) 500 MG tablet Take 500 mg by mouth 2 (two) times daily as needed for muscle spasms. 07/20/22   [provider]  ondansetron  (ZOFRAN -ODT) 4 MG disintegrating tablet Take 1 tablet (4 mg total) by mouth every 6 (six) hours as needed for nausea or vomiting. 10/27/22   Gladis Cough, MD  oxyCODONE  (OXY IR/ROXICODONE ) 5 MG immediate release tablet Take 1 tablet (5 mg total) by mouth every 6 (six) hours as needed for severe pain. 10/27/22   Gladis Cough, MD  pantoprazole  (PROTONIX ) 40 MG tablet Take 1 tablet (40 mg total) by mouth daily. 10/27/22   Gladis Cough, MD    Allergies: Tuberculin tests and Latex    Review of Systems  Gastrointestinal:  Positive for diarrhea, nausea and vomiting.  All other systems reviewed and are negative.   Updated Vital Signs BP  130/74 (BP Location: Right Arm)   Pulse 60   Temp 98.8 F (37.1 C) (Oral)   Resp 17   Ht 6' 1 (1.854 m)   Wt 121.1 kg   SpO2 97%   BMI 35.23 kg/m   Physical Exam Vitals and nursing note reviewed.  Constitutional:      Appearance: Normal appearance.  HENT:     Head: Normocephalic and atraumatic.     Nose: Nose normal.     Mouth/Throat:     Mouth: Mucous membranes are moist.  Eyes:     Extraocular Movements: Extraocular movements intact.     Conjunctiva/sclera: Conjunctivae normal.     Pupils: Pupils are equal, round, and reactive to light.  Cardiovascular:     Rate and Rhythm: Normal rate and regular rhythm.     Pulses: Normal pulses.     Heart sounds: Normal heart sounds. No murmur heard.    No gallop.  Pulmonary:     Effort: Pulmonary effort is normal. No respiratory distress.     Breath sounds: Normal breath sounds. No stridor. No wheezing, rhonchi or rales.  Abdominal:     General: Abdomen is flat. Bowel sounds are normal. There is no distension.     Palpations: Abdomen is soft.     Tenderness: There is no abdominal tenderness. There is no guarding.  Musculoskeletal:        General: Normal range of motion.     Cervical  back: Normal range of motion and neck supple.  Skin:    General: Skin is warm and dry.  Neurological:     General: No focal deficit present.     Mental Status: She is alert and oriented to person, place, and time. Mental status is at baseline.  Psychiatric:        Mood and Affect: Mood normal.        Behavior: Behavior normal.        Thought Content: Thought content normal.        Judgment: Judgment normal.     (all labs ordered are listed, but only abnormal results are displayed) Labs Reviewed  URINALYSIS, ROUTINE W REFLEX MICROSCOPIC - Abnormal; Notable for the following components:      Result Value   Leukocytes,Ua TRACE (*)    All other components within normal limits  RESP PANEL BY RT-PCR (RSV, FLU A&B, COVID)  RVPGX2  LIPASE, BLOOD   COMPREHENSIVE METABOLIC PANEL WITH GFR  CBC WITH DIFFERENTIAL/PLATELET    EKG: None  Radiology: No results found.   Procedures   Medications Ordered in the ED  ondansetron  (ZOFRAN ) injection 4 mg (has no administration in time range)  sodium chloride  0.9 % bolus 1,000 mL (has no administration in time range)  ondansetron  (ZOFRAN ) injection 4 mg (has no administration in time range)                                    Medical Decision Making Amount and/or Complexity of Data Reviewed Labs: ordered.  Risk Prescription drug management.   This patient presents to the ED for concern of abdominal pain, nausea, vomiting diarrhea differential diagnosis includes acute gastroenteritis, enteritis, appendicitis, cholecystitis, bowel torsion, diverticulitis, ovarian torsion or cyst, PID, tumor and abscess, pyelonephritis, kidney stone, pancreatitis, mesenteric ischemia    Additional history obtained:  Additional history obtained from none External records from outside source obtained and reviewed including none   Lab Tests:  I Ordered, and personally interpreted labs.  The pertinent results include: No leukocytosis, no anemia, normal kidney function liver function, normal electrolytes, normal lipase, unremarkable urinalysis, negative viral swab    Medicines ordered and prescription drug management:  I ordered medication including Zofran , IV fluids for nausea and vomiting Reevaluation of the patient after these medicines showed that the patient improved I have reviewed the patients home medicines and have made adjustments as needed   Problem List / ED Course:  Patient is doing much better at this time and is stable for discharge home.  Discussed with patient that all workup in the emergency department has been unremarkable.  Abdominal exam is benign with no focal tenderness throughout.  Do not suspect an acute intra-abdominal surgical process at this time.  Patient has stable  vital signs with no indication for sepsis.  Suspect an acute viral gastroenteritis at this point.  Will continue symptomatic treatment on outpatient basis.  Discussed the need for close follow-up with her primary care doctor on an outpatient basis.  Strict return precautions were provided for any new or worsening symptoms.  Patient voiced understanding and had no additional questions.   Social Determinants of Health:  None        Final diagnoses:  None    ED Discharge Orders     None          Daralene Lonni JONETTA DEVONNA 03/01/24 1532    Garrick Charleston, MD  03/02/24 1541  

## 2024-03-01 NOTE — Discharge Instructions (Addendum)
 Please follow-up closely with your primary care doctor on an outpatient basis.  Return to emergency department immediately for any new or worsening symptoms.

## 2024-03-01 NOTE — ED Triage Notes (Signed)
 Pt arrived via POV c/o N/V/D since yesterday. 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 Pt reports last being able to eat was yesterday.

## 2024-05-30 ENCOUNTER — Encounter (HOSPITAL_COMMUNITY): Payer: Self-pay | Admitting: *Deleted

## 2024-08-16 ENCOUNTER — Emergency Department (HOSPITAL_COMMUNITY)
Admission: EM | Admit: 2024-08-16 | Discharge: 2024-08-16 | Disposition: A | Discharge status: Home / Self Care | Payer: Self-pay | Source: Ambulatory Visit | Attending: Emergency Medicine | Admitting: Emergency Medicine

## 2024-08-16 ENCOUNTER — Other Ambulatory Visit: Payer: Self-pay

## 2024-08-16 ENCOUNTER — Encounter (HOSPITAL_COMMUNITY): Payer: Self-pay

## 2024-08-16 DIAGNOSIS — L02213 Cutaneous abscess of chest wall: Secondary | ICD-10-CM | POA: Insufficient documentation

## 2024-08-16 DIAGNOSIS — Z91040 Latex allergy status: Secondary | ICD-10-CM | POA: Insufficient documentation

## 2024-08-16 MED ORDER — DOXYCYCLINE HYCLATE 100 MG PO TABS
100.0000 mg | ORAL_TABLET | Freq: Once | ORAL | Status: AC
  Administered 2024-08-16: 100 mg via ORAL
  Filled 2024-08-16: qty 1

## 2024-08-16 MED ORDER — DOXYCYCLINE HYCLATE 100 MG PO CAPS: 100.0000 mg | ORAL_CAPSULE | Freq: Two times a day (BID) | ORAL | 0 refills | Status: AC

## 2024-08-16 NOTE — ED Triage Notes (Signed)
 Pt reports abscess in center of her chest that started forming on Friday and then came to a head and popped Monday night and has been draining since. Pt reports pressure and throbbing is improved, now just mild burning. Denies fever. NAD noted in triage.

## 2024-08-16 NOTE — Discharge Instructions (Addendum)
 We evaluated you for your abscess.  Your abscess is already started to drain, so hopefully it should continue to heal.  Please apply warm compresses to your abscess 3-4 times a day to help facilitate ongoing drainage.  We have prescribed you an antibiotic to take to prevent any worsening infection.  Please follow-up closely with your primary doctor.  If your abscess swells again and stops draining, or if you have increasing pain, fevers, redness around the area, please return for recheck.

## 2024-08-16 NOTE — ED Provider Notes (Signed)
 " [place] EMERGENCY DEPARTMENT AT [facility] Provider Note  CSN: [id number] Arrival date & time: 08/16/24 2013  Chief Complaint(s) Abscess  HPI Phyllis Daniels is a 44 y.o. female presenting with wound.  Patient reports that on Friday has had a area of swelling to her midsternal area, on Monday began spontaneously draining.  Had increased pain prior to the drainage.  Has overall been improving but came to get evaluated.  No fevers or chills.  No history of similar.   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 Authorizing Provider  doxycycline  (VIBRAMYCIN ) 100 MG capsule Take 1 capsule (100 mg total) by mouth 2 (two) times daily for 7 days. 08/16/24 08/23/24 Yes Francesca Elsie CROME, MD  cyanocobalamin  (VITAMIN B12) 1000 MCG tablet Take 1,000 mcg by mouth daily.    [provider]  dicyclomine  (BENTYL ) 20 MG tablet Take 1 tablet (20 mg total) by mouth 2 (two) times daily. 03/01/24   Daralene Lonni BIRCH, PA-C  ibuprofen  (ADVIL ) 800 MG tablet Take 800 mg by mouth every 8 (eight) hours as needed for moderate pain. Every 8 hours 01/23/20   [provider]  levonorgestrel (MIRENA) 20 MCG/DAY IUD 1 each by Intrauterine route once.    [provider]  methocarbamol  (ROBAXIN ) 500 MG tablet Take 500 mg by mouth 2 (two) times daily as needed for muscle spasms. 07/20/22   [provider]  ondansetron  (ZOFRAN -ODT) 4 MG disintegrating tablet Take 1  tablet (4 mg total) by mouth every 8 (eight) hours as needed for nausea or vomiting. 03/01/24   Daralene Lonni BIRCH, PA-C  oxyCODONE  (OXY IR/ROXICODONE ) 5 MG immediate release tablet Take 1 tablet (5 mg total) by mouth every 6 (six) hours as needed for severe pain. 10/27/22   Gladis Cough, MD  pantoprazole  (PROTONIX ) 40 MG tablet Take 1 tablet (40 mg total) by mouth daily. 10/27/22   Gladis Cough, MD  ZEPBOUND 2.5 MG/0.5ML Pen Inject 2.5 mg into the skin once a week. 12/22/23   [provider]                                                                                                                                    Past Surgical History Past Surgical History:  Procedure Laterality Date   CESAREAN SECTION     TUBAL LIGATION     UPPER  GI ENDOSCOPY N/A 10/26/2022   Procedure: UPPER GI ENDOSCOPY;  Surgeon: Gladis Cough, MD;  Location: [facility]S;  Service: General;  Laterality: N/A;   Family History Family History  Problem Relation Age of Onset   Hypertension Mother    Cancer Mother        Breast   Lupus Sister    Heart disease Maternal Grandfather    Heart attack Paternal Grandmother    Diabetes Daughter     Social History Social History[1] Allergies Latex and Tuberculin tests  Review of Systems Review of Systems  All other systems reviewed and are negative.   Physical Exam Vital Signs  I have reviewed the triage vital signs BP (!) 134/105 (BP Location: Right Arm)   Pulse 67   Temp 98.4 F (36.9 C) (Oral)   Resp 16   Ht 6' 1 (1.854 m)   Wt 121.1 kg   SpO2 97%   BMI 35.23 kg/m  Physical Exam Vitals and nursing note reviewed.  Constitutional:      Appearance: Normal appearance.  HENT:     Head: Normocephalic and atraumatic.     Mouth/Throat:     Mouth: Mucous membranes are moist.  Eyes:     Conjunctiva/sclera: Conjunctivae normal.  Cardiovascular:     Rate and Rhythm: Normal rate.  Pulmonary:     Effort: Pulmonary effort is normal. No  respiratory distress.  Chest:       Comments: Chaperoned by nurse technician Abdominal:     General: Abdomen is flat.  Musculoskeletal:        General: No deformity.  Skin:    General: Skin is warm and dry.     Capillary Refill: Capillary refill takes less than 2 seconds.  Neurological:     General: No focal deficit present.     Mental Status: She is alert. Mental status is at baseline.  Psychiatric:        Mood and Affect: Mood normal.        Behavior: Behavior normal.     ED Results and Treatments Labs (all labs ordered are listed, but only abnormal results are displayed) Labs Reviewed - No data to display                                                                                                                        Radiology No results found.  Pertinent labs & imaging results that were available during my care of the patient were reviewed by me and considered in my medical decision making (see MDM for details).  Medications Ordered in ED Medications  doxycycline  (VIBRA -TABS) tablet 100 mg (has no administration in time range)  Procedures Procedures  (including critical care time)  Medical Decision Making / ED Course   MDM:  44 year old presenting with abscess.  On exam, patient has abscess which is spontaneously draining.  No fluctuance, surrounding erythema or swelling.  Do not think there is any indication for advanced imaging or further testing.  Should hopefully continue to resolve on its own.  Recommended continued warm compresses.  Will prescribe doxycycline .  Discussed follow-up with primary physician and return precautions to the emergency department     Medicines ordered and prescription drug management: Meds ordered this encounter  Medications   doxycycline  (VIBRAMYCIN ) 100 MG capsule    Sig: Take 1 capsule (100  mg total) by mouth 2 (two) times daily for 7 days.    Dispense:  14 capsule    Refill:  0   doxycycline  (VIBRA -TABS) tablet 100 mg    -I have reviewed the patients home medicines and have made adjustments as needed  Social Determinants of Health:  Diagnosis or treatment significantly limited by social determinants of health: obesity  Co morbidities that complicate the patient evaluation  Past Medical History:  Diagnosis Date   Abnormal Pap smear of cervix    Anxiety    Back pain    DDD (degenerative disc disease), lumbar    Dyspnea    Pre-diabetes       Dispostion: Disposition decision including need for hospitalization was considered, and patient discharged from emergency department.    Final Clinical Impression(s) / ED Diagnoses Final diagnoses:  Abscess of chest wall     This chart was dictated using voice recognition software.  Despite best efforts to proofread,  errors can occur which can change the documentation meaning.     [1]  Social History Tobacco Use   Smoking status: Former    Current packs/day: 0.00    Average packs/day: 0.5 packs/day for 20.0 years (10.0 ttl pk-yrs)    Types: Cigarettes    Start date: 2001    Quit date: 2021    Years since quitting: 5.0   Smokeless tobacco: Never  Vaping Use   Vaping status: Former   Quit date: 10/06/2022  Substance Use Topics   Alcohol use: Yes    Comment: occassional   Drug use: Not Currently    Types: Marijuana     Francesca Elsie CROME, MD 08/16/24 2236  "
